# Patient Record
Sex: Male | Born: 1955 | Race: White | Hispanic: No | Smoking: Never smoker
Health system: Southern US, Community
[De-identification: ages and names within clinical notes are randomized; demographics above are authoritative.]

## PROBLEM LIST (undated history)

## (undated) ENCOUNTER — Ambulatory Visit: Admission: EM | Payer: No Typology Code available for payment source

## (undated) DIAGNOSIS — E785 Hyperlipidemia, unspecified: Secondary | ICD-10-CM

## (undated) DIAGNOSIS — T7840XA Allergy, unspecified, initial encounter: Secondary | ICD-10-CM

## (undated) DIAGNOSIS — I10 Essential (primary) hypertension: Secondary | ICD-10-CM

## (undated) DIAGNOSIS — H409 Unspecified glaucoma: Secondary | ICD-10-CM

## (undated) DIAGNOSIS — M1990 Unspecified osteoarthritis, unspecified site: Secondary | ICD-10-CM

## (undated) HISTORY — DX: Allergy, unspecified, initial encounter: T78.40XA

## (undated) HISTORY — PX: TONSILLECTOMY: SUR1361

## (undated) HISTORY — DX: Unspecified osteoarthritis, unspecified site: M19.90

## (undated) HISTORY — DX: Unspecified glaucoma: H40.9

## (undated) HISTORY — DX: Hyperlipidemia, unspecified: E78.5

---

## 2020-12-28 ENCOUNTER — Ambulatory Visit: Payer: 59 | Admitting: Internal Medicine

## 2021-01-04 ENCOUNTER — Other Ambulatory Visit: Payer: Self-pay | Admitting: Internal Medicine

## 2021-01-04 ENCOUNTER — Encounter: Payer: Self-pay | Admitting: Internal Medicine

## 2021-01-04 ENCOUNTER — Other Ambulatory Visit: Payer: Self-pay

## 2021-01-04 ENCOUNTER — Ambulatory Visit (INDEPENDENT_AMBULATORY_CARE_PROVIDER_SITE_OTHER): Payer: No Typology Code available for payment source | Admitting: Internal Medicine

## 2021-01-04 VITALS — BP 161/84 | HR 69 | Temp 98.5°F | Ht 71.06 in | Wt 265.8 lb

## 2021-01-04 DIAGNOSIS — R5383 Other fatigue: Secondary | ICD-10-CM

## 2021-01-04 DIAGNOSIS — E785 Hyperlipidemia, unspecified: Secondary | ICD-10-CM | POA: Diagnosis not present

## 2021-01-04 DIAGNOSIS — M4810 Ankylosing hyperostosis [Forestier], site unspecified: Secondary | ICD-10-CM

## 2021-01-04 DIAGNOSIS — R0602 Shortness of breath: Secondary | ICD-10-CM

## 2021-01-04 DIAGNOSIS — R933 Abnormal findings on diagnostic imaging of other parts of digestive tract: Secondary | ICD-10-CM

## 2021-01-04 DIAGNOSIS — R011 Cardiac murmur, unspecified: Secondary | ICD-10-CM | POA: Insufficient documentation

## 2021-01-04 LAB — URINALYSIS, ROUTINE W REFLEX MICROSCOPIC
Bilirubin, UA: NEGATIVE
Glucose, UA: NEGATIVE
Ketones, UA: NEGATIVE
Leukocytes,UA: NEGATIVE
Nitrite, UA: NEGATIVE
Protein,UA: NEGATIVE
RBC, UA: NEGATIVE
Specific Gravity, UA: 1.02 (ref 1.005–1.030)
Urobilinogen, Ur: 0.2 mg/dL (ref 0.2–1.0)
pH, UA: 5.5 (ref 5.0–7.5)

## 2021-01-04 LAB — BAYER DCA HB A1C WAIVED: HB A1C (BAYER DCA - WAIVED): 5.8 % (ref <7.0)

## 2021-01-04 MED ORDER — FLUTICASONE PROPIONATE 50 MCG/ACT NA SUSP: 2.0000 | Freq: Every day | NASAL | 6 refills | Status: DC

## 2021-01-04 MED ORDER — FEXOFENADINE HCL 180 MG PO TABS: 180.0000 mg | ORAL_TABLET | Freq: Every day | ORAL | 1 refills | Status: DC

## 2021-01-04 NOTE — Telephone Encounter (Signed)
Routing to provider  

## 2021-01-04 NOTE — Progress Notes (Signed)
BP (!) 161/84   Pulse 69   Temp 98.5 F (36.9 C) (Oral)   Ht 5' 11.06" (1.805 m)   Wt 265 lb 12.8 oz (120.6 kg)   SpO2 96%   BMI 37.01 kg/m    Subjective:    Patient ID: Jason Frye, male    DOB: April 20, 1956, 65 y.o.   MRN: [id number]  Chief Complaint  Patient presents with  . New Patient (Initial Visit)  . Hyperlipidemia    HPI: Jason Frye is a 65 y.o. 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 Has had tingling in right thigh per pt , no muscle weakness has had symptoms which worsen.  Feels fatigued most of the time.      Sister had LBBB and CHF diagnosed when she was in her mid 28's    Hyperlipidemia This is a chronic problem. Associated symptoms include myalgias and shortness of breath. Pertinent negatives include no chest pain, focal sensory loss, focal weakness or leg pain.  Shortness of Breath This is a new problem. The current episode started more than 1 month ago. Pertinent negatives include no abdominal pain, chest pain, claudication, ear pain, fever, headaches, hemoptysis, leg pain, leg swelling, orthopnea, PND, rash, rhinorrhea, sore throat, sputum production, swollen glands, syncope, vomiting or wheezing.    Chief Complaint  Patient presents with  . New Patient (Initial Visit)  . Hyperlipidemia    Relevant past medical, surgical, family and social history reviewed and updated as indicated. Interim medical history since our last visit reviewed. Allergies and medications reviewed and updated.  Review of Systems  Constitutional: Positive for fatigue. Negative for activity change, appetite change, chills and fever.  HENT: Negative for congestion, ear discharge, ear pain, facial swelling, rhinorrhea and sore throat.   Eyes: Negative for pain, discharge and itching.  Respiratory: Positive for  shortness of breath. Negative for cough, hemoptysis, sputum production, chest tightness and wheezing.   Cardiovascular: Negative for chest pain, palpitations, orthopnea, claudication, leg swelling, syncope and PND.  Gastrointestinal: Negative for abdominal distention, abdominal pain, blood in stool, constipation, diarrhea, nausea and vomiting.  Endocrine: Positive for polyphagia. Negative for cold intolerance, heat intolerance, polydipsia and polyuria.  Genitourinary: Negative for difficulty urinating, dysuria, flank pain, frequency, hematuria and urgency.  Musculoskeletal: Positive for back pain and myalgias. Negative for arthralgias, gait problem and joint swelling.  Skin: Negative for color change, rash and wound.  Neurological: Negative for dizziness, tremors, focal weakness, speech difficulty, weakness, light-headedness, numbness and headaches.  Hematological: Does not bruise/bleed easily.  Psychiatric/Behavioral: Negative for agitation, confusion, decreased concentration, sleep disturbance and suicidal ideas.    Per HPI unless specifically indicated above  History reviewed. No pertinent surgical history.   Active Ambulatory Problems    Diagnosis Date Noted  . Cardiac murmur 01/04/2021  . SOB (shortness of breath) 01/04/2021  . Hyperlipidemia 01/04/2021  . DISH (diffuse idiopathic skeletal hyperostosis) 01/04/2021  . Other fatigue 01/04/2021   Resolved Ambulatory Problems    Diagnosis Date Noted  . No Resolved Ambulatory Problems   Past Medical History:  Diagnosis Date  . Allergy   . Arthritis   . Glaucoma        Objective:    BP (!) 161/84   Pulse 69   Temp 98.5 F (36.9 C) (Oral)   Ht 5' 11.06" (1.805  m)   Wt 265 lb 12.8 oz (120.6 kg)   SpO2 96%   BMI 37.01 kg/m   Wt Readings from Last 3 Encounters:  01/04/21 265 lb 12.8 oz (120.6 kg)    Physical Exam Vitals and nursing note reviewed.  Constitutional:      General: He is not in acute distress.     Appearance: Normal appearance. He is not ill-appearing or diaphoretic.  HENT:     Head: Normocephalic and atraumatic.     Right Ear: Tympanic membrane and external ear normal. There is no impacted cerumen.     Left Ear: External ear normal.     Nose: No congestion or rhinorrhea.     Mouth/Throat:     Pharynx: No oropharyngeal exudate or posterior oropharyngeal erythema.  Eyes:     Conjunctiva/sclera: Conjunctivae normal.     Pupils: Pupils are equal, round, and reactive to light.  Cardiovascular:     Rate and Rhythm: Normal rate and regular rhythm.     Heart sounds: No murmur heard. No friction rub. No gallop.   Pulmonary:     Effort: No respiratory distress.     Breath sounds: No stridor. No wheezing or rhonchi.  Chest:     Chest wall: No tenderness.  Abdominal:     General: Abdomen is flat. Bowel sounds are normal. There is no distension.     Palpations: Abdomen is soft. There is no mass.     Tenderness: There is no abdominal tenderness. There is no guarding.  Musculoskeletal:        General: No swelling or deformity.     Cervical back: Normal range of motion and neck supple. No rigidity or tenderness.     Right lower leg: No edema.     Left lower leg: No edema.  Skin:    General: Skin is warm and dry.     Coloration: Skin is not jaundiced.     Findings: No erythema.  Neurological:     Mental Status: He is alert and oriented to person, place, and time. Mental status is at baseline.  Psychiatric:        Mood and Affect: Mood normal.        Behavior: Behavior normal.        Thought Content: Thought content normal.        Judgment: Judgment normal.     No results found for this or any previous visit.      Current Outpatient Medications:  .  B Complex Vitamins (VITAMIN B-COMPLEX) TABS, Take 1 tablet by mouth daily., Disp: , Rfl:  .  meloxicam (MOBIC) 7.5 MG tablet, , Disp: , Rfl:  .  Multiple Vitamin (MULTI-VITAMINS) TABS, Take by mouth., Disp: , Rfl:  .  simvastatin  (ZOCOR) 40 MG tablet, , Disp: , Rfl:       Assessment & Plan:  1. DISH will refert o PT  Refer to neurosurgery  Continue meloxicam for such   2. HLD is on simvastatin for such  recheck FLP, check LFT's work on diet, SE of meds explained to pt. low fat and high fiber diet explained to pt.   3. Fatigue Check TSH / A1c a ? Sec to cardiac / valvular abnormalities  dysfunction  4. HLD :  Is on simavstatin 40 mg daily  recheck FLP, check LFT's work on diet, SE of meds explained to pt. low fat and high fiber diet explained to pt.  4. Allergic rhinitis / seasonal allergy Dry  cough nasal congestion  Start flonase/ allegra  5. SOB / fatigue / Cardiac murmer  Will check ECHo   6. abnl Cscope - will need to recheck with Gi for such  Had 10 polyps removed per Pts verbal record.  Will need to obtain records.   Problem List Items Addressed This Visit   None      Follow up plan: No follow-ups on file.  Health Maintenance : PSA : check  Cscope : 3 yrs ago. Found 10 polyps and to go back this year. Pneumonia vaccine : yes 2021

## 2021-01-04 NOTE — Telephone Encounter (Signed)
Requested medication (s) are due for refill today: yes  Requested medication (s) are on the active medication list: yes  Last refill:    Future visit scheduled: yes  Notes to clinic:  Pharmacy comment: Alternative Requested:NOT COVERED.   Requested Prescriptions  Pending Prescriptions Disp Refills   flunisolide (NASALIDE) 25 MCG/ACT (0.025%) SOLN [Pharmacy Med Name: FLUNISOLIDE 0.025% SPRAY]  0      Ear, Nose, and Throat: Nasal Preparations - Corticosteroids Passed - 01/04/2021 10:30 AM      Passed - Valid encounter within last 12 months    Recent Outpatient Visits           Today DISH (diffuse idiopathic skeletal hyperostosis)   [facility] Vigg, Avanti, MD       Future Appointments             In 4 weeks Vigg, Avanti, MD [facility]

## 2021-01-04 NOTE — Telephone Encounter (Signed)
Seen today. 

## 2021-01-05 LAB — CBC WITH DIFFERENTIAL/PLATELET
Basophils Absolute: 0 10*3/uL (ref 0.0–0.2)
Basos: 1 %
EOS (ABSOLUTE): 0.2 10*3/uL (ref 0.0–0.4)
Eos: 3 %
Hematocrit: 43.3 % (ref 37.5–51.0)
Hemoglobin: 14.6 g/dL (ref 13.0–17.7)
Immature Grans (Abs): 0 10*3/uL (ref 0.0–0.1)
Immature Granulocytes: 0 %
Lymphocytes Absolute: 2.6 10*3/uL (ref 0.7–3.1)
Lymphs: 35 %
MCH: 30.4 pg (ref 26.6–33.0)
MCHC: 33.7 g/dL (ref 31.5–35.7)
MCV: 90 fL (ref 79–97)
Monocytes Absolute: 0.7 10*3/uL (ref 0.1–0.9)
Monocytes: 9 %
Neutrophils Absolute: 3.8 10*3/uL (ref 1.4–7.0)
Neutrophils: 52 %
Platelets: 264 10*3/uL (ref 150–450)
RBC: 4.8 x10E6/uL (ref 4.14–5.80)
RDW: 13 % (ref 11.6–15.4)
WBC: 7.3 10*3/uL (ref 3.4–10.8)

## 2021-01-05 LAB — LIPID PANEL
Chol/HDL Ratio: 4.4 ratio (ref 0.0–5.0)
Cholesterol, Total: 173 mg/dL (ref 100–199)
HDL: 39 mg/dL — ABNORMAL LOW (ref >39)
LDL Chol Calc (NIH): 99 mg/dL (ref 0–99)
Triglycerides: 202 mg/dL — ABNORMAL HIGH (ref 0–149)
VLDL Cholesterol Cal: 35 mg/dL (ref 5–40)

## 2021-01-05 LAB — COMPREHENSIVE METABOLIC PANEL
ALT: 32 IU/L (ref 0–44)
AST: 26 IU/L (ref 0–40)
Albumin/Globulin Ratio: 1.6 (ref 1.2–2.2)
Albumin: 4.4 g/dL (ref 3.8–4.8)
Alkaline Phosphatase: 73 IU/L (ref 44–121)
BUN/Creatinine Ratio: 19 (ref 10–24)
BUN: 22 mg/dL (ref 8–27)
Bilirubin Total: 0.4 mg/dL (ref 0.0–1.2)
CO2: 22 mmol/L (ref 20–29)
Calcium: 9.8 mg/dL (ref 8.6–10.2)
Chloride: 100 mmol/L (ref 96–106)
Creatinine, Ser: 1.16 mg/dL (ref 0.76–1.27)
Globulin, Total: 2.7 g/dL (ref 1.5–4.5)
Glucose: 100 mg/dL — ABNORMAL HIGH (ref 65–99)
Potassium: 4.2 mmol/L (ref 3.5–5.2)
Sodium: 141 mmol/L (ref 134–144)
Total Protein: 7.1 g/dL (ref 6.0–8.5)
eGFR: 71 mL/min/{1.73_m2} (ref >59)

## 2021-01-05 LAB — TSH: TSH: 2.82 u[IU]/mL (ref 0.450–4.500)

## 2021-01-05 LAB — PSA: Prostate Specific Ag, Serum: 0.7 ng/mL (ref 0.0–4.0)

## 2021-01-16 ENCOUNTER — Other Ambulatory Visit: Payer: Self-pay | Admitting: Internal Medicine

## 2021-01-25 ENCOUNTER — Other Ambulatory Visit: Payer: Self-pay

## 2021-01-25 ENCOUNTER — Encounter: Payer: Self-pay | Admitting: Internal Medicine

## 2021-01-25 ENCOUNTER — Ambulatory Visit
Admission: RE | Admit: 2021-01-25 | Discharge: 2021-01-25 | Disposition: A | Discharge status: Home / Self Care | Payer: No Typology Code available for payment source | Source: Ambulatory Visit | Attending: Internal Medicine | Admitting: Internal Medicine

## 2021-01-25 ENCOUNTER — Ambulatory Visit (INDEPENDENT_AMBULATORY_CARE_PROVIDER_SITE_OTHER): Payer: No Typology Code available for payment source | Admitting: Internal Medicine

## 2021-01-25 VITALS — BP 126/77 | HR 71 | Temp 98.3°F | Ht 71.06 in | Wt 255.4 lb

## 2021-01-25 DIAGNOSIS — R5383 Other fatigue: Secondary | ICD-10-CM | POA: Diagnosis not present

## 2021-01-25 DIAGNOSIS — R0600 Dyspnea, unspecified: Secondary | ICD-10-CM | POA: Diagnosis not present

## 2021-01-25 DIAGNOSIS — M4810 Ankylosing hyperostosis [Forestier], site unspecified: Secondary | ICD-10-CM | POA: Diagnosis not present

## 2021-01-25 DIAGNOSIS — R0602 Shortness of breath: Secondary | ICD-10-CM

## 2021-01-25 DIAGNOSIS — E785 Hyperlipidemia, unspecified: Secondary | ICD-10-CM | POA: Diagnosis not present

## 2021-01-25 DIAGNOSIS — Z1211 Encounter for screening for malignant neoplasm of colon: Secondary | ICD-10-CM

## 2021-01-25 LAB — ECHOCARDIOGRAM COMPLETE
AR max vel: 1.34 cm2
AV Area VTI: 1.28 cm2
AV Area mean vel: 1.1 cm2
AV Mean grad: 10.7 mmHg
AV Peak grad: 17 mmHg
Ao pk vel: 2.06 m/s
Area-P 1/2: 3.87 cm2
Height: 71.06 in
S' Lateral: 3 cm
Weight: 4086.4 oz

## 2021-01-25 NOTE — Progress Notes (Signed)
BP 126/77   Pulse 71   Temp 98.3 F (36.8 C) (Oral)   Ht 5' 11.06" (1.805 m)   Wt 255 lb 6.4 oz (115.8 kg)   SpO2 98%   BMI 35.56 kg/m    Subjective:    Patient ID: Jason Frye, male    DOB: [birth year], 65 y.o.   MRN: [id number]  Chief Complaint  Patient presents with   DISH   Lab review    HPI: Jason Frye is a 65 y.o. male  Pt is here for a follow up from his last visit, to have an ECHo tdoay  still co SOBOE occaisonal ,Fatigue +ve   Shortness of Breath This is a recurrent problem. The current episode started more than 1 month ago. The problem has been waxing and waning. Associated symptoms include coryza. Pertinent negatives include no abdominal pain, chest pain, claudication, fever, hemoptysis, leg pain, leg swelling, neck pain, orthopnea, PND, rash, rhinorrhea, sore throat, sputum production, swollen glands, syncope, vomiting or wheezing. Associated symptoms comments: Mornings has some dry cough .   Chief Complaint  Patient presents with   DISH   Lab review    Relevant past medical, surgical, family and social history reviewed and updated as indicated. Interim medical history since our last visit reviewed. Allergies and medications reviewed and updated.  Review of Systems  Constitutional:  Negative for fever.  HENT:  Negative for rhinorrhea and sore throat.   Respiratory:  Positive for shortness of breath. Negative for hemoptysis, sputum production and wheezing.   Cardiovascular:  Negative for chest pain, orthopnea, claudication, leg swelling, syncope and PND.  Gastrointestinal:  Negative for abdominal pain and vomiting.  Musculoskeletal:  Negative for neck pain.  Skin:  Negative for rash.   Per HPI unless specifically indicated above     Objective:    BP 126/77   Pulse 71   Temp 98.3 F (36.8 C) (Oral)   Ht 5' 11.06" (1.805 m)   Wt 255 lb 6.4 oz (115.8 kg)   SpO2 98%   BMI 35.56 kg/m   Wt Readings from Last 3 Encounters:  01/25/21 255 lb 6.4 oz  (115.8 kg)  01/04/21 265 lb 12.8 oz (120.6 kg)    Physical Exam Vitals and nursing note reviewed.  Constitutional:      General: He is not in acute distress.    Appearance: Normal appearance. He is not ill-appearing or diaphoretic.  HENT:     Head: Normocephalic and atraumatic.     Right Ear: Tympanic membrane and external ear normal. There is no impacted cerumen.     Left Ear: External ear normal.     Nose: No congestion or rhinorrhea.     Mouth/Throat:     Pharynx: No oropharyngeal exudate or posterior oropharyngeal erythema.  Eyes:     Conjunctiva/sclera: Conjunctivae normal.     Pupils: Pupils are equal, round, and reactive to light.  Cardiovascular:     Rate and Rhythm: Normal rate and regular rhythm.     Heart sounds: No murmur heard.   No friction rub. No gallop.  Pulmonary:     Effort: No respiratory distress.     Breath sounds: No stridor. No wheezing or rhonchi.  Chest:     Chest wall: No tenderness.  Abdominal:     General: Abdomen is flat. Bowel sounds are normal.     Palpations: Abdomen is soft. There is no mass.     Tenderness: There is no abdominal tenderness.  Musculoskeletal:  Cervical back: Normal range of motion and neck supple. No rigidity or tenderness.     Left lower leg: No edema.  Skin:    General: Skin is warm and dry.  Neurological:     Mental Status: He is alert.    Results for orders placed or performed in visit on 01/04/21  TSH  Result Value Ref Range   TSH 2.820 0.450 - 4.500 uIU/mL  PSA  Result Value Ref Range   Prostate Specific Ag, Serum 0.7 0.0 - 4.0 ng/mL  Lipid panel  Result Value Ref Range   Cholesterol, Total 173 100 - 199 mg/dL   Triglycerides 202 (H) 0 - 149 mg/dL   HDL 39 (L) >39 mg/dL   VLDL Cholesterol Cal 35 5 - 40 mg/dL   LDL Chol Calc (NIH) 99 0 - 99 mg/dL   Chol/HDL Ratio 4.4 0.0 - 5.0 ratio  CBC with Differential/Platelet  Result Value Ref Range   WBC 7.3 3.4 - 10.8 x10E3/uL   RBC 4.80 4.14 - 5.80 x10E6/uL    Hemoglobin 14.6 13.0 - 17.7 g/dL   Hematocrit 43.3 37.5 - 51.0 %   MCV 90 79 - 97 fL   MCH 30.4 26.6 - 33.0 pg   MCHC 33.7 31.5 - 35.7 g/dL   RDW 13.0 11.6 - 15.4 %   Platelets 264 150 - 450 x10E3/uL   Neutrophils 52 Not Estab. %   Lymphs 35 Not Estab. %   Monocytes 9 Not Estab. %   Eos 3 Not Estab. %   Basos 1 Not Estab. %   Neutrophils Absolute 3.8 1.4 - 7.0 x10E3/uL   Lymphocytes Absolute 2.6 0.7 - 3.1 x10E3/uL   Monocytes Absolute 0.7 0.1 - 0.9 x10E3/uL   EOS (ABSOLUTE) 0.2 0.0 - 0.4 x10E3/uL   Basophils Absolute 0.0 0.0 - 0.2 x10E3/uL   Immature Granulocytes 0 Not Estab. %   Immature Grans (Abs) 0.0 0.0 - 0.1 x10E3/uL  Comprehensive metabolic panel  Result Value Ref Range   Glucose 100 (H) 65 - 99 mg/dL   BUN 22 8 - 27 mg/dL   Creatinine, Ser 1.16 0.76 - 1.27 mg/dL   eGFR 71 >59 mL/min/1.73   BUN/Creatinine Ratio 19 10 - 24   Sodium 141 134 - 144 mmol/L   Potassium 4.2 3.5 - 5.2 mmol/L   Chloride 100 96 - 106 mmol/L   CO2 22 20 - 29 mmol/L   Calcium 9.8 8.6 - 10.2 mg/dL   Total Protein 7.1 6.0 - 8.5 g/dL   Albumin 4.4 3.8 - 4.8 g/dL   Globulin, Total 2.7 1.5 - 4.5 g/dL   Albumin/Globulin Ratio 1.6 1.2 - 2.2   Bilirubin Total 0.4 0.0 - 1.2 mg/dL   Alkaline Phosphatase 73 44 - 121 IU/L   AST 26 0 - 40 IU/L   ALT 32 0 - 44 IU/L  Urinalysis, Routine w reflex microscopic  Result Value Ref Range   Specific Gravity, UA 1.020 1.005 - 1.030   pH, UA 5.5 5.0 - 7.5   Color, UA Yellow Yellow   Appearance Ur Clear Clear   Leukocytes,UA Negative Negative   Protein,UA Negative Negative/Trace   Glucose, UA Negative Negative   Ketones, UA Negative Negative   RBC, UA Negative Negative   Bilirubin, UA Negative Negative   Urobilinogen, Ur 0.2 0.2 - 1.0 mg/dL   Nitrite, UA Negative Negative  Bayer DCA Hb A1c Waived (STAT)  Result Value Ref Range   HB A1C (BAYER DCA - WAIVED) 5.8 <  7.0 %        Current Outpatient Medications:    B Complex Vitamins (VITAMIN B-COMPLEX) TABS,  Take 1 tablet by mouth daily., Disp: , Rfl:    meloxicam (MOBIC) 7.5 MG tablet, , Disp: , Rfl:    Multiple Vitamin (MULTI-VITAMINS) TABS, Take by mouth., Disp: , Rfl:    simvastatin (ZOCOR) 40 MG tablet, , Disp: , Rfl:    fexofenadine (ALLEGRA ALLERGY) 180 MG tablet, Take 1 tablet (180 mg total) by mouth daily., Disp: 30 tablet, Rfl: 1   fluticasone (FLONASE) 50 MCG/ACT nasal spray, Place 2 sprays into both nostrils daily., Disp: 16 g, Rfl: 6    Assessment & Plan:  HLD recheck FLP, check LFT's work on diet, SE of meds explained to pt. low fat and high fiber diet explained to pt. TG high  Has been eating more veggies, more salads, smaller portions.  Saw his TG cut back on chips , icecream.  Has done lifestyle modifications.  Has lost about 10 lbs of weight.   Ref. Range 01/04/2021 10:04  Total CHOL/HDL Ratio Latest Ref Range: 0.0 - 5.0 ratio 4.4  Cholesterol, Total Latest Ref Range: 100 - 199 mg/dL 173  HDL Cholesterol Latest Ref Range: >39 mg/dL 39 (L)  Triglycerides Latest Ref Range: 0 - 149 mg/dL 202 (H)  VLDL Cholesterol Cal Latest Ref Range: 5 - 40 mg/dL 35  LDL Chol Calc (NIH) Latest Ref Range: 0 - 99 mg/dL 99    2. Prediabetes Lifestyle modifications advised to pt. A1c at 5.8   Portion control and avoiding high carb low fat diet advised.  Diet plan given to pt   exercise plan given and encouraged.  To increase exercise to 150 mins a week ie 21/2 hours a week. Pt verbalises understanding of the above.    3. SOB :To have an ECHO today   4 DISH will refert o PT Refer to neurosurgery Continue meloxicam for such   5.HLD is on simvastatin for such  recheck FLP, check LFT's work on diet, SE of meds explained to pt. low fat and high fiber diet explained to pt.     6.Fatigue Check TSH / A1c a ? Sec to cardiac / valvular abnormalities  dysfunction   7. HLD :  Is on simavstatin 40 mg daily  recheck FLP, check LFT's work on diet, SE of meds explained to pt. low fat and high  fiber diet explained to pt.   8.Allergic rhinitis / seasonal allergy Dry cough nasal congestion Start flonase/ allegra   9.Abnl Cscope - fu and mx per GI      Problem List Items Addressed This Visit   None    Orders Placed This Encounter  Procedures   Ambulatory referral to Gastroenterology     No orders of the defined types were placed in this encounter.    Follow up plan: No follow-ups on file.

## 2021-01-25 NOTE — Progress Notes (Signed)
*  PRELIMINARY RESULTS* Echocardiogram 2D Echocardiogram has been performed.  Jason Frye 01/25/2021, 12:21 PM

## 2021-02-01 ENCOUNTER — Other Ambulatory Visit: Payer: Self-pay

## 2021-02-01 ENCOUNTER — Telehealth (INDEPENDENT_AMBULATORY_CARE_PROVIDER_SITE_OTHER): Payer: No Typology Code available for payment source | Admitting: Internal Medicine

## 2021-02-01 ENCOUNTER — Telehealth: Payer: Self-pay | Admitting: *Deleted

## 2021-02-01 ENCOUNTER — Encounter: Payer: Self-pay | Admitting: Internal Medicine

## 2021-02-01 DIAGNOSIS — M4810 Ankylosing hyperostosis [Forestier], site unspecified: Secondary | ICD-10-CM

## 2021-02-01 DIAGNOSIS — I5189 Other ill-defined heart diseases: Secondary | ICD-10-CM

## 2021-02-01 MED ORDER — VERAPAMIL HCL ER 120 MG PO TBCR: 120.0000 mg | EXTENDED_RELEASE_TABLET | Freq: Every day | ORAL | 2 refills | Status: DC

## 2021-02-01 MED ORDER — SIMVASTATIN 40 MG PO TABS: 40.0000 mg | ORAL_TABLET | Freq: Every evening | ORAL | 4 refills | Status: DC

## 2021-02-01 NOTE — Progress Notes (Addendum)
Chief Complaint  Patient presents with   Shortness of Breath    SOB has been getting much better in the last week or so   Echo     Review results     There were no vitals taken for this visit.   Subjective:    Patient ID: Jason Frye, male    DOB: May 24, 1956, 65 y.o.   MRN: [id number]  Chief Complaint  Patient presents with   Shortness of Breath    SOB has been getting much better in the last week or so   Echo     Review results    HPI: Jason Frye is a 65 y.o. male  Has a HO DISH , here for a fu on SOB / ECHO which shows Grade 1 dystolic dysfunction :   Shortness of Breath This is a chronic problem. Episode onset: Has SOBOE , better. Comes out of the blue per pt. Has had fatigue too. Better per pt. Pertinent negatives include no abdominal pain, chest pain, claudication, coryza, hemoptysis, leg pain, leg swelling or neck pain.   Chief Complaint  Patient presents with   Shortness of Breath    SOB has been getting much better in the last week or so   Echo     Review results    Relevant past medical, surgical, family and social history reviewed and updated as indicated. Interim medical history since our last visit reviewed. Allergies and medications reviewed and updated.  Review of Systems  Respiratory:  Positive for shortness of breath. Negative for hemoptysis.   Cardiovascular:  Negative for chest pain, claudication and leg swelling.  Gastrointestinal:  Negative for abdominal pain.  Musculoskeletal:  Negative for neck pain.   Per HPI unless specifically indicated above     Objective:    There were no vitals taken for this visit.  Wt Readings from Last 3 Encounters:  01/25/21 255 lb 6.4 oz (115.8 kg)  01/04/21 265 lb 12.8 oz (120.6 kg)    Physical Exam Constitutional:      General: He is not in acute distress.    Appearance: He is well-developed. He is not ill-appearing, toxic-appearing or diaphoretic.  Neurological:     Mental Status: He is alert.     Unable to peform sec to virtual visit.   Results for orders placed or performed during the hospital encounter of 01/25/21  ECHOCARDIOGRAM COMPLETE  Result Value Ref Range   Weight 4,086.4 oz   Height 71.06 in   BP 126/77 mmHg   Ao pk vel 2.06 m/s   AV Area VTI 1.28 cm2   AR max vel 1.34 cm2   AV Mean grad 10.7 mmHg   AV Peak grad 17.0 mmHg   S' Lateral 3.00 cm   AV Area mean vel 1.10 cm2   Area-P 1/2 3.87 cm2        Current Outpatient Medications:    verapamil (CALAN-SR) 120 MG CR tablet, Take 1 tablet (120 mg total) by mouth at bedtime., Disp: 30 tablet, Rfl: 2   B Complex Vitamins (VITAMIN B-COMPLEX) TABS, Take 1 tablet by mouth daily., Disp: , Rfl:    fexofenadine (ALLEGRA ALLERGY) 180 MG tablet, Take 1 tablet (180 mg total) by mouth daily., Disp: 30 tablet, Rfl: 1   fluticasone (FLONASE) 50 MCG/ACT nasal spray, Place 2 sprays into both nostrils daily., Disp: 16 g, Rfl: 6   meloxicam (MOBIC) 7.5 MG tablet, , Disp: , Rfl:    Multiple Vitamin (MULTI-VITAMINS) TABS, Take by  mouth., Disp: , Rfl:    simvastatin (ZOCOR) 40 MG tablet, Take 1 tablet (40 mg total) by mouth every evening., Disp: 90 tablet, Rfl: 4    Assessment & Plan:  Grade 1 dystolic dysfunction : No edema continues to have SOB Will start pt on verapamil for such. Will refer to cards .  Htn prehypertension ? Has had some high readings on arrival 130 at home - 120's  125/75 mm hg.    Problem List Items Addressed This Visit       Musculoskeletal and Integument   DISH (diffuse idiopathic skeletal hyperostosis)   Relevant Orders   Ambulatory referral to Cardiology   Other Visit Diagnoses     Diastolic dysfunction    -  Primary   Relevant Orders   Ambulatory referral to Cardiology        Orders Placed This Encounter  Procedures   Ambulatory referral to Cardiology     Meds ordered this encounter  Medications   verapamil (CALAN-SR) 120 MG CR tablet    Sig: Take 1 tablet (120 mg total) by  mouth at bedtime.    Dispense:  30 tablet    Refill:  2   simvastatin (ZOCOR) 40 MG tablet    Sig: Take 1 tablet (40 mg total) by mouth every evening.    Dispense:  90 tablet    Refill:  4     Follow up plan: Return in about 6 weeks (around 03/15/2021).   This visit was completed via video visit through MyChart due to the restrictions of the COVID-19 pandemic. All issues as above were discussed and addressed. Physical exam was done as above through visual confirmation on video through MyChart. If it was felt that the patient should be evaluated in the office, they were directed there. The patient verbally consented to this visit. Location of the patient: home Location of the provider: work Those involved with this call:  Provider: Loura Pardon, MD CMA: Rolley Sims, CMA Front Desk/Registration: Beverely Pace  Time spent on call:  10 minutes with patient face to face via video conference. More than 50% of this time was spent in counseling and coordination of care. 10 minutes total spent in review of patient's record and preparation of their chart.

## 2021-02-01 NOTE — Telephone Encounter (Signed)
LVM for pt. To call and schedule next appointment along with a LAB appointment per Dr. Charlotta Newton. He needs a  6 week Fu on new bp meds/. Diastolic dysfunction CMP, FLP Labs 1 week prior to next visit.

## 2021-02-03 ENCOUNTER — Telehealth: Payer: Self-pay

## 2021-02-03 NOTE — Telephone Encounter (Signed)
Copied from CRM [id number]. Topic: General - Other >> Feb 03, 2021  2:33 PM Payton Spark N wrote: Reason for CRM: Pt called in stating when he was going to pick up his medications verapamil (CALAN-SR) 120 MG CR tablet at the pharmacy, that they told him that there is a indication or interaction with another medication he is taken, and wanted someone to reach out to see what he should next, or if he should take it.

## 2021-02-06 ENCOUNTER — Other Ambulatory Visit: Payer: Self-pay | Admitting: Internal Medicine

## 2021-02-06 MED ORDER — VERAPAMIL HCL ER 120 MG PO TBCR: 120.0000 mg | EXTENDED_RELEASE_TABLET | Freq: Every day | ORAL | 2 refills | Status: DC

## 2021-02-06 NOTE — Telephone Encounter (Signed)
Left message for patient to give our office a call back

## 2021-02-06 NOTE — Telephone Encounter (Signed)
Spoke with patient and notified him per Dr.Vigg patient is OK to go ahead and start the prescription Verapamil. Patient verbalized understanding and has no further questions at this time.

## 2021-02-06 NOTE — Telephone Encounter (Signed)
There arent any interactions ok to start. Pl let pt know thnx.

## 2021-02-12 DIAGNOSIS — U071 COVID-19: Secondary | ICD-10-CM | POA: Insufficient documentation

## 2021-02-20 ENCOUNTER — Telehealth (INDEPENDENT_AMBULATORY_CARE_PROVIDER_SITE_OTHER): Payer: No Typology Code available for payment source | Admitting: Internal Medicine

## 2021-02-20 ENCOUNTER — Encounter: Payer: Self-pay | Admitting: Internal Medicine

## 2021-02-20 VITALS — BP 129/71 | HR 83

## 2021-02-20 DIAGNOSIS — U071 COVID-19: Secondary | ICD-10-CM

## 2021-02-20 MED ORDER — FEXOFENADINE HCL 180 MG PO TABS: 180.0000 mg | ORAL_TABLET | Freq: Every day | ORAL | 1 refills | Status: DC

## 2021-02-20 MED ORDER — BENZONATATE 100 MG PO CAPS: 100.0000 mg | ORAL_CAPSULE | Freq: Two times a day (BID) | ORAL | 1 refills | Status: DC | PRN

## 2021-02-20 MED ORDER — AZITHROMYCIN 250 MG PO TABS: ORAL_TABLET | ORAL | 0 refills | Status: AC

## 2021-02-20 NOTE — Progress Notes (Signed)
BP 129/71   Pulse 83    Subjective:    Patient ID: Jason Frye, male    DOB: 1956/01/17, 65 y.o.   MRN: [id number]  Chief Complaint  Patient presents with   Covid Positive    Tested positive on last week Monday, cough, fever(last night 101.8),chills, congestions, tired, achy, altered sense of taste and lot of fatigue    HPI: Daton Szilagyi is a 65 y.o. male   This visit was completed via video visit through MyChart due to the restrictions of the COVID-19 pandemic. All issues as above were discussed and addressed. Physical exam was done as above through visual confirmation on video through MyChart. If it was felt that the patient should be evaluated in the office, they were directed there. The patient verbally consented to this visit. Location of the patient: home Location of the provider: work Those involved with this call:  Provider: Loura Pardon, MD CMA: Tristan Schroeder, CMA Front Desk/Registration: Harriet Pho  Time spent on call: 15` minutes with patient face to face via video conference. More than 50% of this time was spent in counseling and coordination of care. 10 minutes total spent in review of patient's record and preparation of their chart.  When he started to show signs last Sunday.  Still has a cough - has green phlegm , started feeling bettr last Wednesday and then Thursday onwards started feling worse, little bit of a fevr cough, congestion, achy  Hadnt lost sense of taste, although not what it used to be. Taking tylenol and ibubrufen.   Fever was 101 F   URI  This is a new problem. The current episode started in the past 7 days.   Chief Complaint  Patient presents with   Covid Positive    Tested positive on last week Monday, cough, fever(last night 101.8),chills, congestions, tired, achy, altered sense of taste and lot of fatigue    Relevant past medical, surgical, family and social history reviewed and updated as indicated. Interim medical history since our  last visit reviewed. Allergies and medications reviewed and updated.  Review of Systems  Per HPI unless specifically indicated above     Objective:    BP 129/71   Pulse 83   Wt Readings from Last 3 Encounters:  01/25/21 255 lb 6.4 oz (115.8 kg)  01/04/21 265 lb 12.8 oz (120.6 kg)    Physical Exam  Unable to peform sec to virtual visit.   Results for orders placed or performed during the hospital encounter of 01/25/21  ECHOCARDIOGRAM COMPLETE  Result Value Ref Range   Weight 4,086.4 oz   Height 71.06 in   BP 126/77 mmHg   Ao pk vel 2.06 m/s   AV Area VTI 1.28 cm2   AR max vel 1.34 cm2   AV Mean grad 10.7 mmHg   AV Peak grad 17.0 mmHg   S' Lateral 3.00 cm   AV Area mean vel 1.10 cm2   Area-P 1/2 3.87 cm2        Current Outpatient Medications:    B Complex Vitamins (VITAMIN B-COMPLEX) TABS, Take 1 tablet by mouth daily., Disp: , Rfl:    meloxicam (MOBIC) 7.5 MG tablet, , Disp: , Rfl:    Multiple Vitamin (MULTI-VITAMINS) TABS, Take by mouth., Disp: , Rfl:    simvastatin (ZOCOR) 40 MG tablet, Take 1 tablet (40 mg total) by mouth every evening., Disp: 90 tablet, Rfl: 4   verapamil (CALAN-SR) 120 MG CR tablet, Take 1 tablet (120  mg total) by mouth at bedtime., Disp: 30 tablet, Rfl: 2    Assessment & Plan:  COVID : positive : last week, will need to start pt on zpak Increase fluid intake. Headahce - tyelnol every 4-6 hrs prn and alternate this with ibubrufen 800 mg q 8 hrly.  Tessalon pearls for cough.  Sinus pressure: use steam inhalation.  OTC -  Allegra / claritin.  Pt verbalized understanding of such  Problem List Items Addressed This Visit   None    No orders of the defined types were placed in this encounter.    Meds ordered this encounter  Medications   azithromycin (ZITHROMAX) 250 MG tablet    Sig: Take 2 tablets on day 1, then 1 tablet daily on days 2 through 5    Dispense:  6 tablet    Refill:  0   fexofenadine (ALLEGRA ALLERGY) 180 MG tablet     Sig: Take 1 tablet (180 mg total) by mouth daily.    Dispense:  10 tablet    Refill:  1   benzonatate (TESSALON) 100 MG capsule    Sig: Take 1 capsule (100 mg total) by mouth 2 (two) times daily as needed for cough.    Dispense:  20 capsule    Refill:  1     Follow up plan: No follow-ups on file.

## 2021-02-23 ENCOUNTER — Telehealth (INDEPENDENT_AMBULATORY_CARE_PROVIDER_SITE_OTHER): Payer: Self-pay | Admitting: Gastroenterology

## 2021-02-23 DIAGNOSIS — Z86010 Personal history of colonic polyps: Secondary | ICD-10-CM

## 2021-02-23 MED ORDER — CLENPIQ 10-3.5-12 MG-GM -GM/160ML PO SOLN: 1.0000 | Freq: Once | ORAL | 0 refills | Status: AC

## 2021-02-23 NOTE — Progress Notes (Signed)
Gastroenterology Pre-Procedure Review  Request Date: 04/27/21 Requesting Physician: Dr. Maximino Greenland  PATIENT REVIEW QUESTIONS: The patient responded to the following health history questions as indicated:    1. Are you having any GI issues? no 2. Do you have a personal history of Polyps? yes (11/2017) 3. Do you have a family history of Colon Cancer or Polyps? no 4. Diabetes Mellitus? no 5. Joint replacements in the past 12 months?no 6. Major health problems in the past 3 months?no 7. Any artificial heart valves, MVP, or defibrillator?no    MEDICATIONS & ALLERGIES:    Patient reports the following regarding taking any anticoagulation/antiplatelet therapy:   Plavix, Coumadin, Eliquis, Xarelto, Lovenox, Pradaxa, Brilinta, or Effient? no Aspirin? no  Patient confirms/reports the following medications:  Current Outpatient Medications  Medication Sig Dispense Refill   azithromycin (ZITHROMAX) 250 MG tablet Take 2 tablets on day 1, then 1 tablet daily on days 2 through 5 6 tablet 0   B Complex Vitamins (VITAMIN B-COMPLEX) TABS Take 1 tablet by mouth daily.     benzonatate (TESSALON) 100 MG capsule Take 1 capsule (100 mg total) by mouth 2 (two) times daily as needed for cough. 20 capsule 1   fexofenadine (ALLEGRA ALLERGY) 180 MG tablet Take 1 tablet (180 mg total) by mouth daily. 10 tablet 1   meloxicam (MOBIC) 7.5 MG tablet      Multiple Vitamin (MULTI-VITAMINS) TABS Take by mouth.     simvastatin (ZOCOR) 40 MG tablet Take 1 tablet (40 mg total) by mouth every evening. 90 tablet 4   verapamil (CALAN-SR) 120 MG CR tablet Take 1 tablet (120 mg total) by mouth at bedtime. 30 tablet 2   No current facility-administered medications for this visit.    Patient confirms/reports the following allergies:  Allergies  Allergen Reactions   Penicillins     No orders of the defined types were placed in this encounter.   AUTHORIZATION INFORMATION Primary Insurance: 1D#: Group #:  Secondary  Insurance: 1D#: Group #:  SCHEDULE INFORMATION: Date: 04/27/21 Time: Location: [facility]

## 2021-02-24 ENCOUNTER — Ambulatory Visit: Payer: Self-pay

## 2021-02-24 ENCOUNTER — Telehealth: Payer: Self-pay | Admitting: Internal Medicine

## 2021-02-24 MED ORDER — METHYLPREDNISOLONE 4 MG PO TBPK: ORAL_TABLET | ORAL | 0 refills | Status: DC

## 2021-02-24 MED ORDER — CHERATUSSIN AC 100-10 MG/5ML PO SOLN: 5.0000 mL | Freq: Three times a day (TID) | ORAL | 0 refills | Status: DC | PRN

## 2021-02-24 NOTE — Telephone Encounter (Signed)
Patient called in says still has cough and congestin and fatigued. He says the medicine given by Dr Charlotta Newton isnt working. Please call back with any other suggestions   Pt. Reports he still not feeling well. Had visit 02/20/21 and prescribed medications. Still has chest congestion, cough, fatigue. Reports he is no better.States he was instructed to call back if no better. Please advise.

## 2021-02-24 NOTE — Telephone Encounter (Signed)
Called pt after receieving message that he wasn't feeling better Pt is  COVID +ve.  Still feels Very tired, cough non productive, if he does anything . Pt was out of the 5 day period to start anti virals when seen last.  Start pt on medrol dose pak. Cough is worse in the morning and at night, when he coughs he wants to get a hard cough to get it out but cannot cough hard enough. And Cheratussin . No fever.  Increase fluid intake.  Headahce - tyelnol every 4-6 hrs prn and alternate this with ibubrufen 800 mg q 8 hrly. Sinus pressure: use steam inhalation.  OTC -  Allegra / claritin.  Pt verbalized understanding of such Advised to call the office or go to the ER if he develops chest pain , any new onset of shortness of breath dizziness, or worsening symptoms.   Pt verbalized understanding of such.   Jason Frye

## 2021-02-24 NOTE — Telephone Encounter (Signed)
Spoke to pt and sent meds in

## 2021-03-07 ENCOUNTER — Ambulatory Visit: Payer: No Typology Code available for payment source | Admitting: Internal Medicine

## 2021-03-13 ENCOUNTER — Other Ambulatory Visit: Payer: No Typology Code available for payment source

## 2021-03-20 ENCOUNTER — Other Ambulatory Visit: Payer: Self-pay

## 2021-03-20 ENCOUNTER — Encounter: Payer: Self-pay | Admitting: Cardiology

## 2021-03-20 ENCOUNTER — Ambulatory Visit (INDEPENDENT_AMBULATORY_CARE_PROVIDER_SITE_OTHER): Payer: No Typology Code available for payment source | Admitting: Cardiology

## 2021-03-20 ENCOUNTER — Ambulatory Visit: Payer: No Typology Code available for payment source | Admitting: Internal Medicine

## 2021-03-20 VITALS — BP 112/66 | HR 66 | Ht 71.0 in | Wt 252.4 lb

## 2021-03-20 DIAGNOSIS — R011 Cardiac murmur, unspecified: Secondary | ICD-10-CM | POA: Diagnosis not present

## 2021-03-20 DIAGNOSIS — Z6835 Body mass index (BMI) 35.0-35.9, adult: Secondary | ICD-10-CM | POA: Diagnosis not present

## 2021-03-20 DIAGNOSIS — E782 Mixed hyperlipidemia: Secondary | ICD-10-CM | POA: Diagnosis not present

## 2021-03-20 NOTE — Progress Notes (Signed)
Cardiology Office Note:    Date:  03/20/2021   ID:  Jason Frye, DOB September 10, 1955, MRN [id number]  PCP:  Loura Pardon, MD   [facility] Providers Cardiologist:  None     Referring MD: Loura Pardon, MD   Chief Complaint  Patient presents with   Other    Diastolic Dysfunction/Discuss Echo. Meds reviewed verbally with pt.   Jason Frye is a 65 y.o. male who is being seen today for the evaluation of abnormal echocardiogram at the request of Vigg, Avanti, MD.   History of Present Illness:    Jason Frye is a 65 y.o. male with a hx of hyperlipidemia who presents due to abnormal echo.  Patient had an echocardiogram performed 01/25/2021, EF was noted to be normal, diastolic function read as impaired relaxation/grade 1 diastolic dysfunction.  He denies chest pain or shortness of breath.  Plays basketball at least once a week without any symptoms.  Also plays in the park with his grandkids without any symptoms.  During his echocardiogram procedure, his heart rates were noted to be elevated.  He has a history of a cardiac murmur.  He is working on losing weight, has lost 15 pounds over the past 2 months.  Past Medical History:  Diagnosis Date   Allergy    Arthritis    Glaucoma    Hyperlipidemia     History reviewed. No pertinent surgical history.  Current Medications: Current Meds  Medication Sig   B Complex Vitamins (VITAMIN B-COMPLEX) TABS Take 1 tablet by mouth daily.   meloxicam (MOBIC) 7.5 MG tablet    Multiple Vitamin (MULTI-VITAMINS) TABS Take by mouth.   simvastatin (ZOCOR) 40 MG tablet Take 1 tablet (40 mg total) by mouth every evening.   verapamil (CALAN-SR) 120 MG CR tablet Take 1 tablet (120 mg total) by mouth at bedtime.     Allergies:   Penicillins   Social History   Socioeconomic History   Marital status: Divorced    Spouse name: Not on file   Number of children: Not on file   Years of education: Not on file   Highest education level: Not on file   Occupational History   Not on file  Tobacco Use   Smoking status: Never   Smokeless tobacco: Never  Vaping Use   Vaping Use: Never used  Substance and Sexual Activity   Alcohol use: Yes    Comment: on occasion   Drug use: Never   Sexual activity: Not Currently  Other Topics Concern   Not on file  Social History Narrative   Not on file   Social Determinants of Health   Financial Resource Strain: Not on file  Food Insecurity: Not on file  Transportation Needs: Not on file  Physical Activity: Not on file  Stress: Not on file  Social Connections: Not on file     Family History: The patient's family history includes Breast cancer in his sister; Cancer in his father, mother, sister, and sister; Heart Problems in his mother, sister, and sister; Heart disease in his father and sister; Thyroid cancer in his sister.  ROS:   Please see the history of present illness.     All other systems reviewed and are negative.  EKGs/Labs/Other Studies Reviewed:    The following studies were reviewed today:   EKG:  EKG is  ordered today.  The ekg ordered today demonstrates normal sinus rhythm, normal ECG  Recent Labs: 01/04/2021: ALT 32; BUN 22; Creatinine, Ser 1.16; Hemoglobin 14.6;  Platelets 264; Potassium 4.2; Sodium 141; TSH 2.820  Recent Lipid Panel    Component Value Date/Time   CHOL 173 01/04/2021 1004   TRIG 202 (H) 01/04/2021 1004   HDL 39 (L) 01/04/2021 1004   CHOLHDL 4.4 01/04/2021 1004   LDLCALC 99 01/04/2021 1004     Risk Assessment/Calculations:          Physical Exam:    VS:  BP 112/66 (BP Location: Right Arm, Patient Position: Sitting, Cuff Size: Large)   Pulse 66   Ht 5\' 11"  (1.803 m)   Wt 252 lb 6 oz (114.5 kg)   SpO2 98%   BMI 35.20 kg/m     Wt Readings from Last 3 Encounters:  03/20/21 252 lb 6 oz (114.5 kg)  01/25/21 255 lb 6.4 oz (115.8 kg)  01/04/21 265 lb 12.8 oz (120.6 kg)     GEN:  Well nourished, well developed in no acute distress HEENT:  Normal NECK: No JVD; No carotid bruits LYMPHATICS: No lymphadenopathy CARDIAC: RRR, 1-2/6 systolic murmur RESPIRATORY:  Clear to auscultation without rales, wheezing or rhonchi  ABDOMEN: Soft, non-tender, non-distended MUSCULOSKELETAL:  No edema; No deformity  SKIN: Warm and dry NEUROLOGIC:  Alert and oriented x 3 PSYCHIATRIC:  Normal affect   ASSESSMENT:    1. Systolic murmur   2. Mixed hyperlipidemia   3. BMI 35.0-35.9,adult    PLAN:    In order of problems listed above:  Systolic murmur, mild aortic valve calcification with no significant stenosis noted on echocardiogram.  EF normal, impaired relaxation/grade 1 diastolic dysfunction.  Patient made aware of results, impaired relaxation likely from obesity, hyperlipidemia.  Has no cardiac symptoms.  Management of risk factors including obesity, hyperlipidemia will help with diastolic dysfunction.  No additional cardiac testing indicated at this time. Hyperlipidemia, low calorie diet, weight loss advised.  Continue simvastatin. Obesity, encouraged on current weight loss.  Advised to continue diet and exercise.  Follow-up as needed     Medication Adjustments/Labs and Tests Ordered: Current medicines are reviewed at length with the patient today.  Concerns regarding medicines are outlined above.  Orders Placed This Encounter  Procedures   EKG 12-Lead   No orders of the defined types were placed in this encounter.   Patient Instructions  Medication Instructions:  Your physician recommends that you continue on your current medications as directed. Please refer to the Current Medication list given to you today.  *If you need a refill on your cardiac medications before your next appointment, please call your pharmacy*   Lab Work: None ordered If you have labs (blood work) drawn today and your tests are completely normal, you will receive your results only by: MyChart Message (if you have MyChart) OR A paper copy in the  mail If you have any lab test that is abnormal or we need to change your treatment, we will call you to review the results.   Testing/Procedures: None ordered   Follow-Up: At [facility], you and your health needs are our priority.  As part of our continuing mission to provide you with exceptional heart care, we have created designated Provider Care Teams.  These Care Teams include your primary Cardiologist (physician) and Advanced Practice Providers (APPs -  Physician Assistants and Nurse Practitioners) who all work together to provide you with the care you need, when you need it.  We recommend signing up for the patient portal called "MyChart".  Sign up information is provided on this After Visit Summary.  MyChart  is used to connect with patients for Virtual Visits (Telemedicine).  Patients are able to view lab/test results, encounter notes, upcoming appointments, etc.  Non-urgent messages can be sent to your provider as well.   To learn more about what you can do with MyChart, go to [email or web address].    Your next appointment:   Follow up as needed   The format for your next appointment:   In Person  Provider:   Debbe Odea, MD   Other Instructions    Signed, Debbe Odea, MD  03/20/2021 5:20 PM    [facility]

## 2021-03-20 NOTE — Patient Instructions (Signed)

## 2021-03-29 ENCOUNTER — Other Ambulatory Visit: Payer: Self-pay | Admitting: Neurosurgery

## 2021-03-29 DIAGNOSIS — M5416 Radiculopathy, lumbar region: Secondary | ICD-10-CM

## 2021-03-29 DIAGNOSIS — M4810 Ankylosing hyperostosis [Forestier], site unspecified: Secondary | ICD-10-CM

## 2021-03-29 DIAGNOSIS — G959 Disease of spinal cord, unspecified: Secondary | ICD-10-CM

## 2021-04-04 ENCOUNTER — Other Ambulatory Visit: Payer: No Typology Code available for payment source

## 2021-04-04 ENCOUNTER — Other Ambulatory Visit: Payer: Self-pay

## 2021-04-04 DIAGNOSIS — E785 Hyperlipidemia, unspecified: Secondary | ICD-10-CM

## 2021-04-04 DIAGNOSIS — R5383 Other fatigue: Secondary | ICD-10-CM

## 2021-04-05 LAB — LIPID PANEL W/O CHOL/HDL RATIO
Cholesterol, Total: 192 mg/dL (ref 100–199)
HDL: 36 mg/dL — ABNORMAL LOW (ref >39)
LDL Chol Calc (NIH): 121 mg/dL — ABNORMAL HIGH (ref 0–99)
Triglycerides: 200 mg/dL — ABNORMAL HIGH (ref 0–149)
VLDL Cholesterol Cal: 35 mg/dL (ref 5–40)

## 2021-04-05 LAB — TSH: TSH: 2.83 u[IU]/mL (ref 0.450–4.500)

## 2021-04-05 LAB — HEMOGLOBIN A1C
Est. average glucose Bld gHb Est-mCnc: 120 mg/dL
Hgb A1c MFr Bld: 5.8 % — ABNORMAL HIGH (ref 4.8–5.6)

## 2021-04-09 ENCOUNTER — Ambulatory Visit: Payer: No Typology Code available for payment source

## 2021-04-10 ENCOUNTER — Telehealth: Payer: Self-pay

## 2021-04-10 NOTE — Telephone Encounter (Signed)
Pt. Calling to reschedule colonoscopy. He says that he does not have anyone to drive him home.

## 2021-04-11 ENCOUNTER — Other Ambulatory Visit: Payer: Self-pay

## 2021-04-11 ENCOUNTER — Ambulatory Visit: Payer: No Typology Code available for payment source | Admitting: Nurse Practitioner

## 2021-04-11 NOTE — Telephone Encounter (Signed)
Procedure has been rescheduled for 06/16/21.

## 2021-04-11 NOTE — Progress Notes (Signed)
Patient has requested to reschedule procedure for 06/16/21.

## 2021-04-17 ENCOUNTER — Ambulatory Visit: Payer: No Typology Code available for payment source | Admitting: Internal Medicine

## 2021-04-20 ENCOUNTER — Telehealth: Payer: Self-pay | Admitting: Internal Medicine

## 2021-04-20 NOTE — Telephone Encounter (Signed)
Lmom to advise that we need to reschedule appointment scheduled for 9/23 due to provider out of the office.   

## 2021-04-21 ENCOUNTER — Ambulatory Visit: Payer: No Typology Code available for payment source | Admitting: Internal Medicine

## 2021-04-21 NOTE — Telephone Encounter (Signed)
Rescheduled patient appointment

## 2021-05-08 ENCOUNTER — Ambulatory Visit
Admission: RE | Admit: 2021-05-08 | Discharge: 2021-05-08 | Disposition: A | Discharge status: Home / Self Care | Payer: No Typology Code available for payment source | Source: Ambulatory Visit | Attending: Neurosurgery | Admitting: Neurosurgery

## 2021-05-08 ENCOUNTER — Other Ambulatory Visit: Payer: Self-pay

## 2021-05-08 DIAGNOSIS — G959 Disease of spinal cord, unspecified: Secondary | ICD-10-CM | POA: Diagnosis present

## 2021-05-08 DIAGNOSIS — M4810 Ankylosing hyperostosis [Forestier], site unspecified: Secondary | ICD-10-CM

## 2021-05-08 DIAGNOSIS — M5416 Radiculopathy, lumbar region: Secondary | ICD-10-CM

## 2021-05-08 IMAGING — MR MR THORACIC SPINE W/O CM
6 series · 30 of 48 positions shown · non-contrast
Comparison: None.

CLINICAL DATA: Diffuse idiopathic skeletal hyperostosis.
Myelopathy. Chronic low back pain radiating to the right thigh.

EXAM:
MRI THORACIC SPINE WITHOUT CONTRAST
TECHNIQUE: Multiplanar, multisequence MR imaging of the thoracic spine was
performed. No intravenous contrast was administered.

[Series 18: T1 · sagittal · 6.0mm · 1.88mm/px · 2 of 9 slices shown (1 of 2)]
[im 1/9]
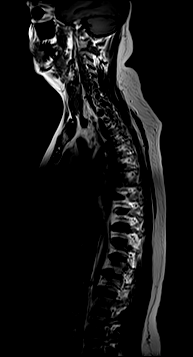
[im 9/9]
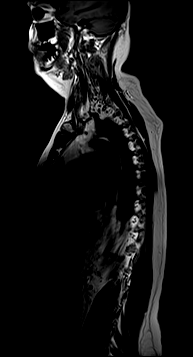

[Series 19: T2 · sagittal · 3.0mm · 1.06mm/px · 6 of 17 slices shown (1 of 2)]
[im 1/17]
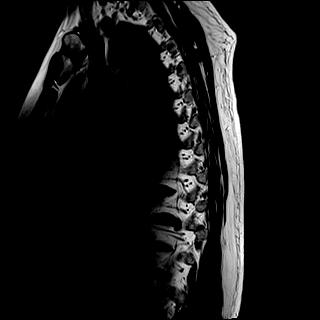
[im 4/17]
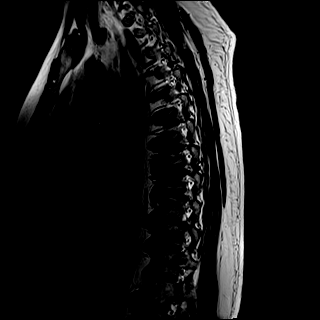
[im 7/17]
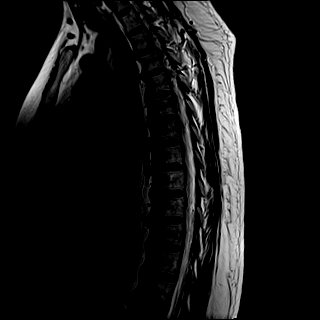
[im 10/17]
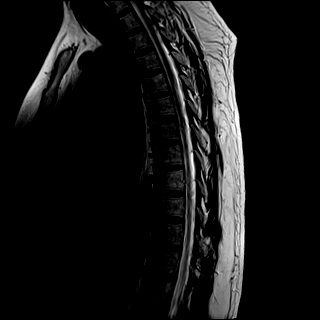
[im 13/17]
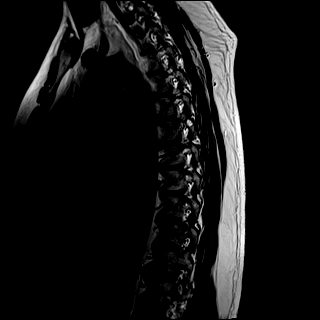
[im 17/17]
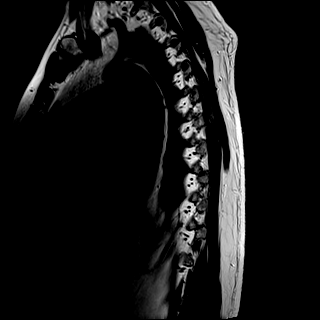

[Series 20: T1 · sagittal · 3.0mm · 1.06mm/px · 6 of 17 slices shown (2 of 2)]
[im 1/17]
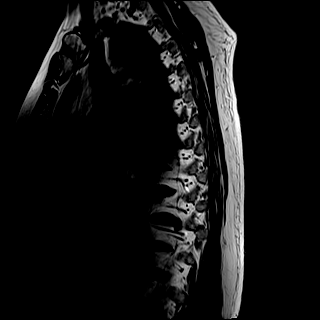
[im 4/17]
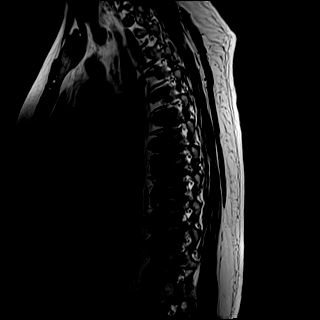
[im 7/17]
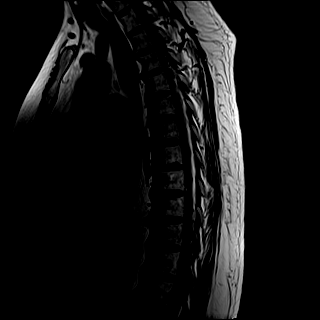
[im 10/17]
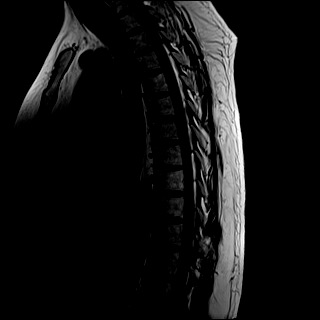
[im 13/17]
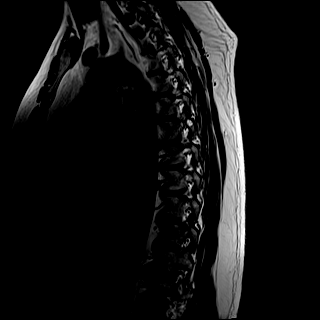
[im 17/17]
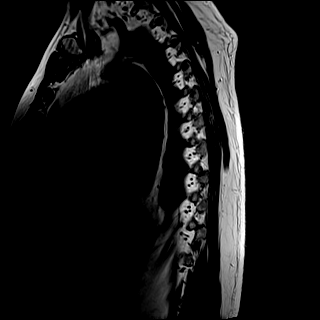

[Series 21: STIR · sagittal · 3.0mm · 0.53mm/px · 6 of 17 slices shown]
[im 1/17]
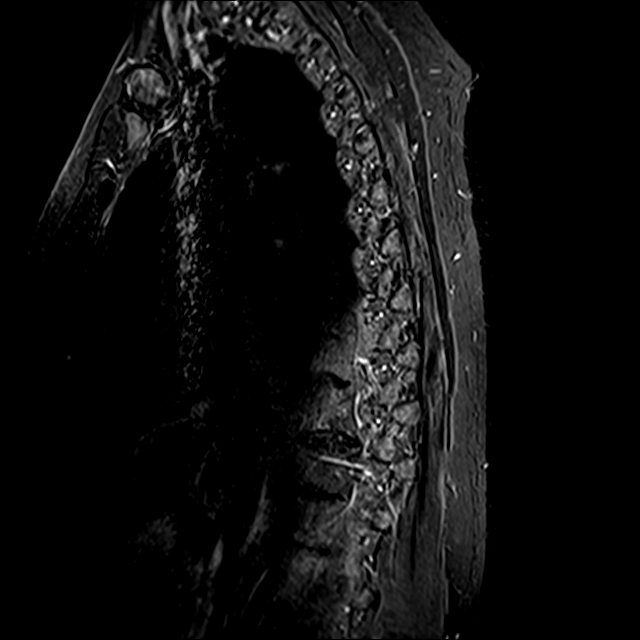
[im 4/17]
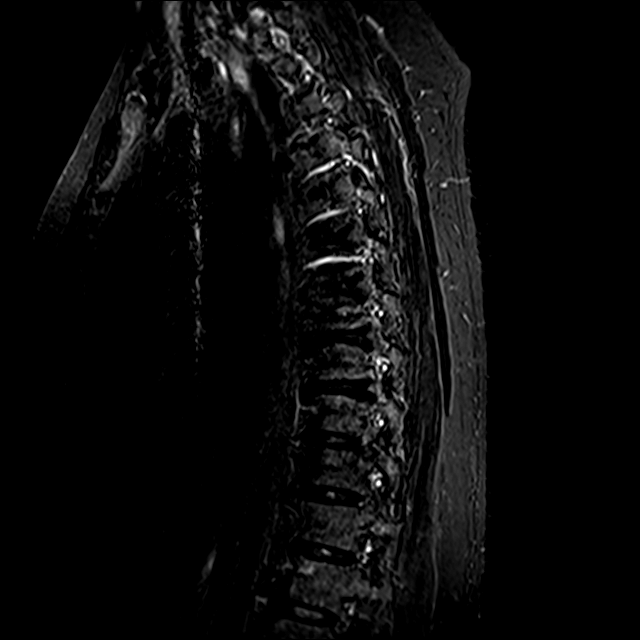
[im 7/17]
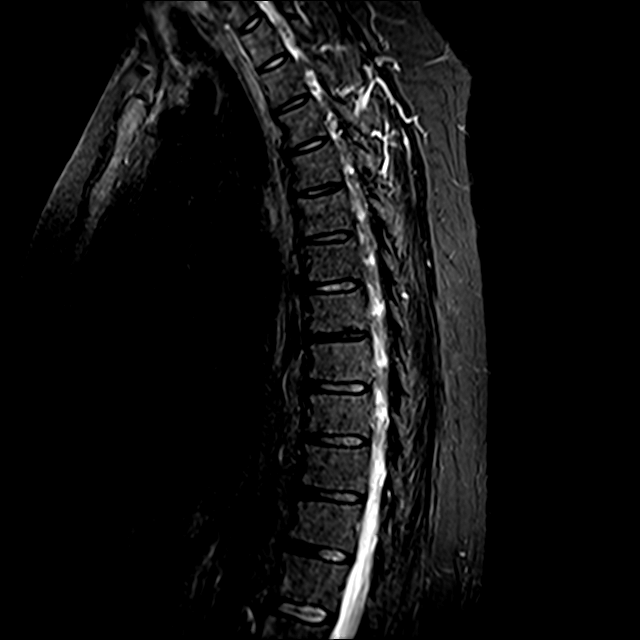
[im 10/17]
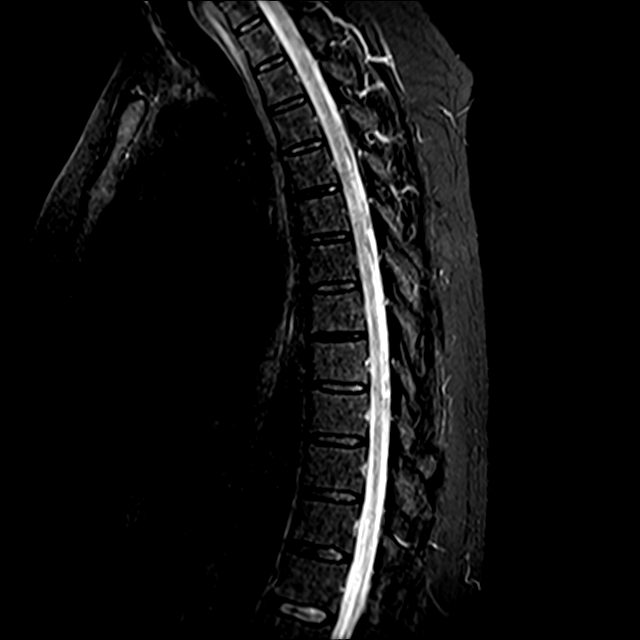
[im 13/17]
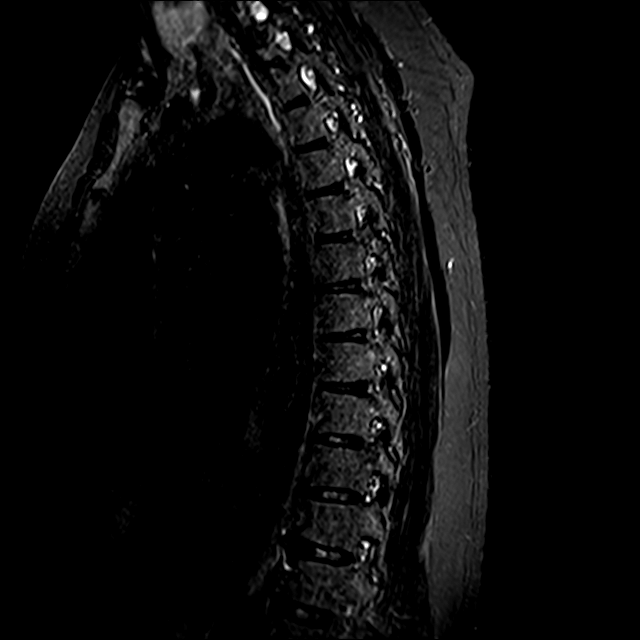
[im 17/17]
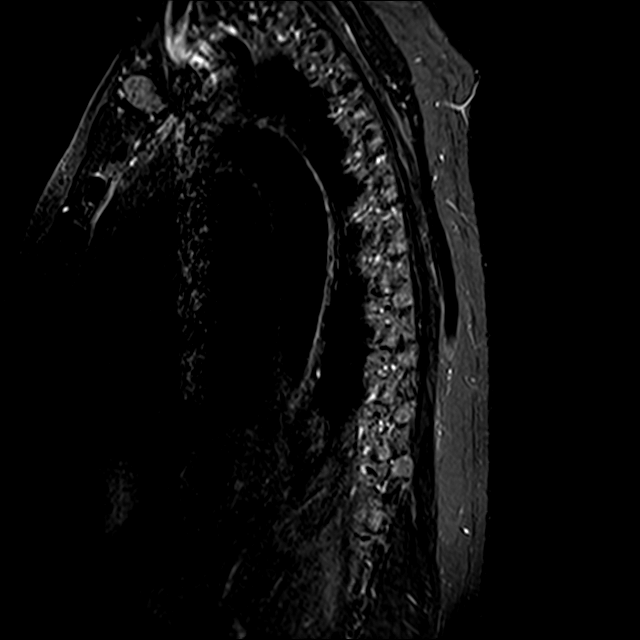

[Series 22: T2 · axial · 4.0mm · 0.59mm/px · z∈[-369,-107]mm · 8 of 39 slices shown (2 of 2)]
[im 1/39]
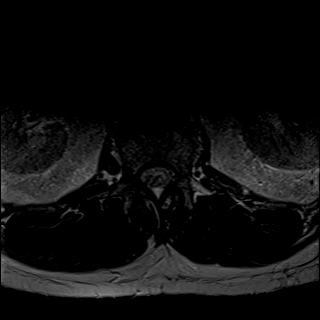
[im 6/39]
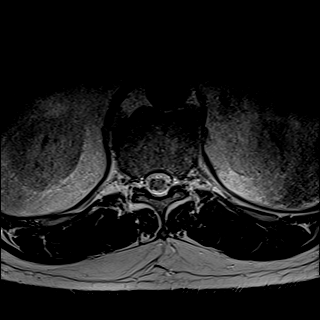
[im 12/39]
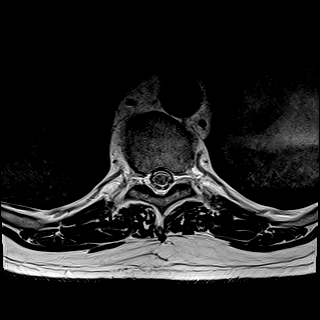
[im 18/39]
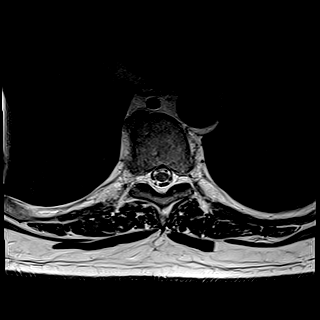
[im 21/39]
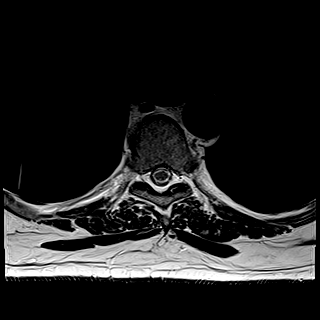
[im 27/39]
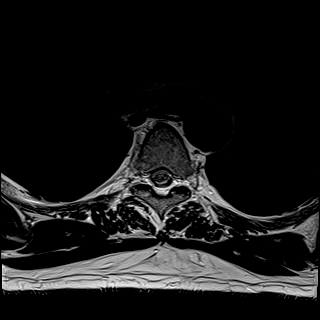
[im 33/39]
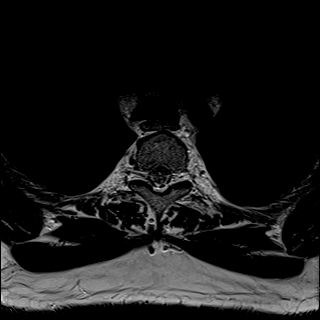
[im 39/39]
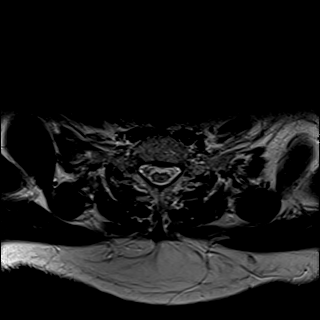

[Series 23: GRE · axial · 4.0mm · 0.37mm/px · z∈[-369,-331]mm · 2 of 39 slices shown]
[im 1/39]
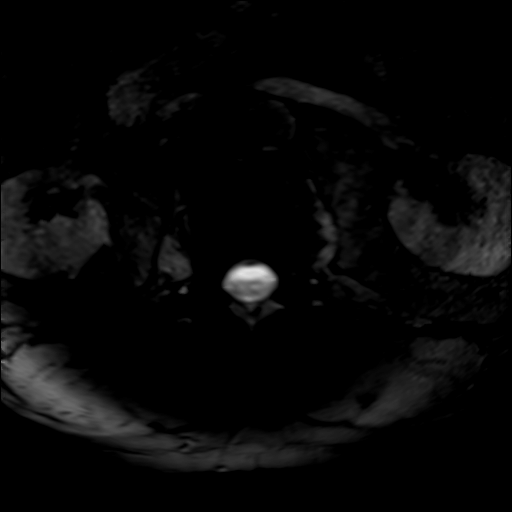
[im 6/39]
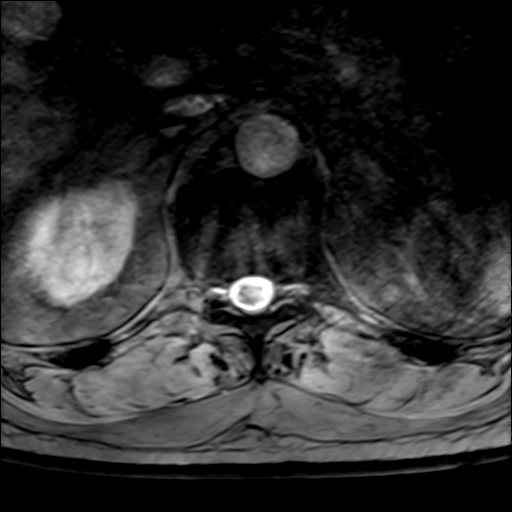

[30 of 48 positions shown; findings below may reference images not displayed]

FINDINGS: Alignment:  Normal.

Vertebrae: No fracture, suspicious marrow lesion, or significant
marrow edema.

Cord:  Normal signal and morphology.

Paraspinal and other soft tissues: Unremarkable.

Disc levels:

Spondylosis with predominantly right-sided anterolateral vertebral
osteophytes throughout the thoracic spine, largest at T9-10. Mild
facet arthrosis at most levels in the thoracic spine. No disc
herniation, spinal stenosis, or neural foraminal stenosis.
IMPRESSION: Mild thoracic spondylosis and facet arthrosis without stenosis.

## 2021-05-08 IMAGING — MR MR LUMBAR SPINE W/O CM
5 series · 31 of 48 positions shown · non-contrast
Comparison: None.

CLINICAL DATA: Diffuse idiopathic skeletal hyperostosis. Lumbar
radiculopathy. Chronic low back pain radiating to the right thigh.

EXAM:
MRI LUMBAR SPINE WITHOUT CONTRAST
TECHNIQUE: Multiplanar, multisequence MR imaging of the lumbar spine was
performed. No intravenous contrast was administered.

[Series 16: T2 · sagittal · 4.0mm · 0.81mm/px · 6 of 17 slices shown (1 of 2)]
[im 1/17]
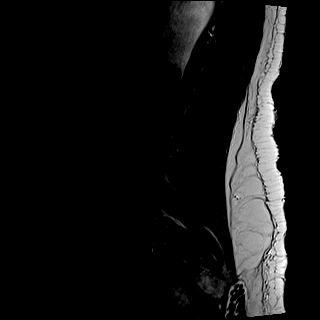
[im 4/17]
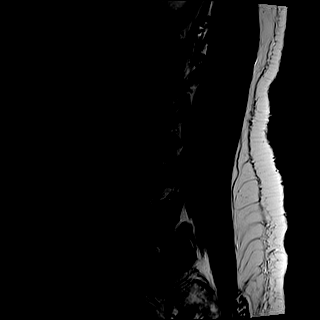
[im 7/17]
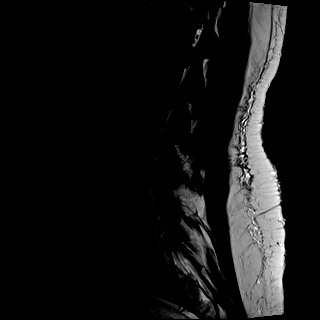
[im 10/17]
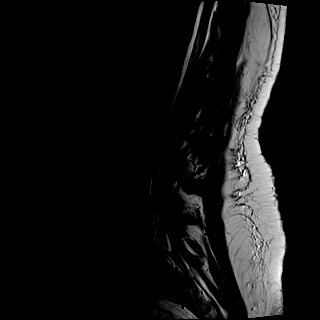
[im 13/17]
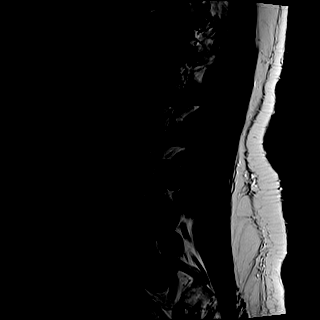
[im 17/17]
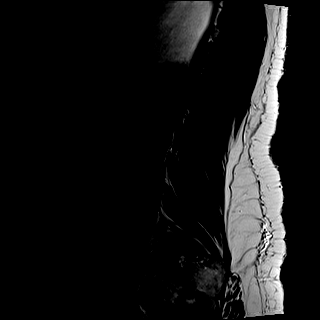

[Series 17: T1 · sagittal · 4.0mm · 0.81mm/px · 6 of 17 slices shown (1 of 2)]
[im 1/17]
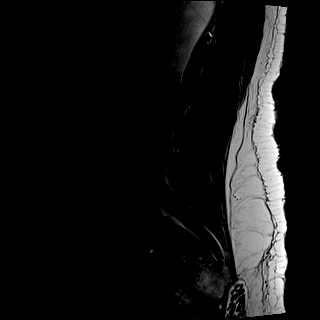
[im 4/17]
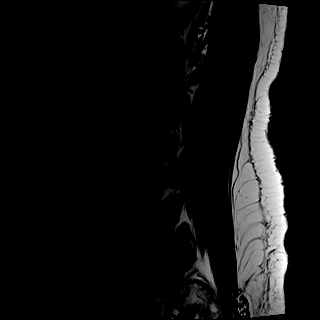
[im 7/17]
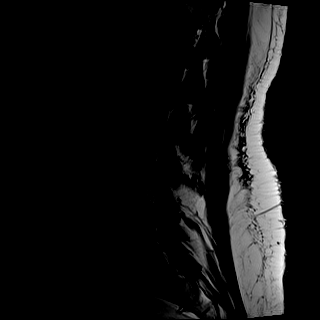
[im 10/17]
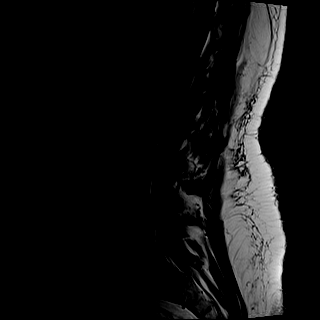
[im 13/17]
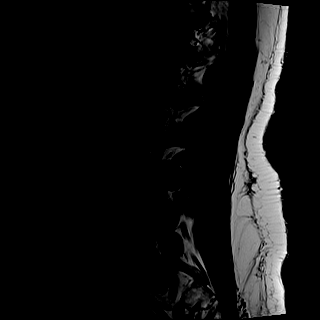
[im 17/17]
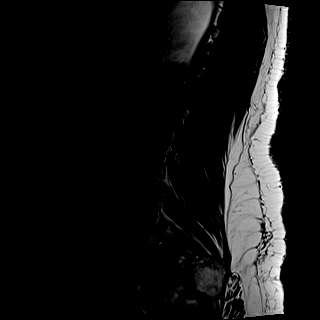

[Series 18: STIR · sagittal · 4.0mm · 0.41mm/px · 1 of 17 slices shown]
[im 1/17]
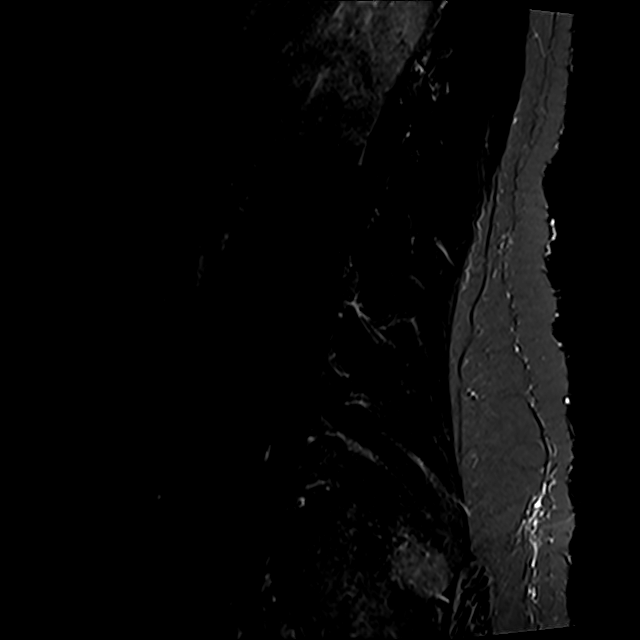

[Series 19: T2 · axial · 4.0mm · 0.78mm/px · z∈[-189,+42]mm · 9 of 39 slices shown (2 of 2)]
[im 1/39]
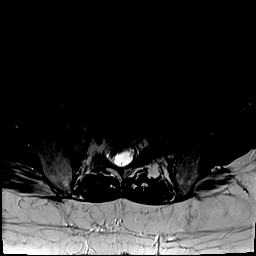
[im 6/39]
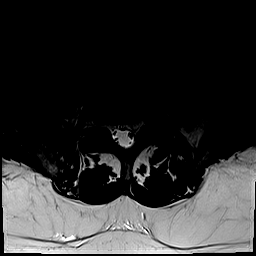
[im 11/39]
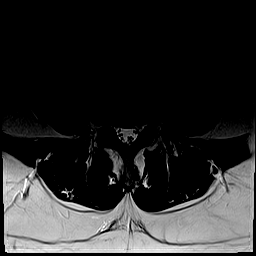
[im 17/39]
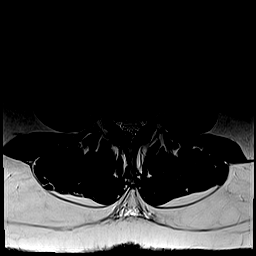
[im 20/39]
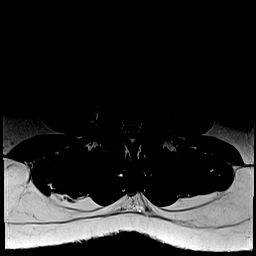
[im 22/39]
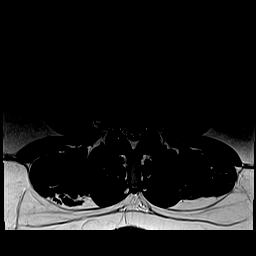
[im 28/39]
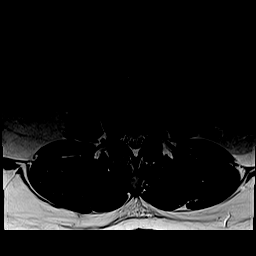
[im 33/39]
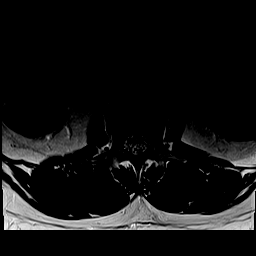
[im 39/39]
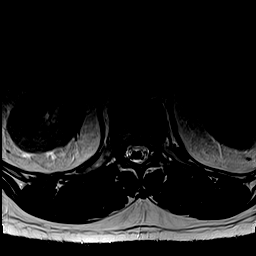

[Series 20: T1 · axial · 4.0mm · 0.39mm/px · z∈[-189,+42]mm · 9 of 39 slices shown (2 of 2)]
[im 1/39]
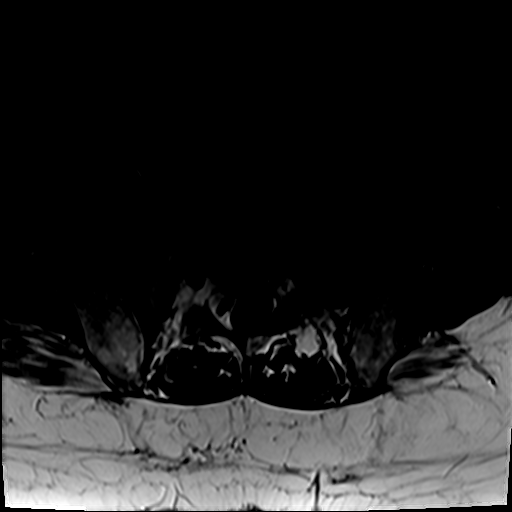
[im 6/39]
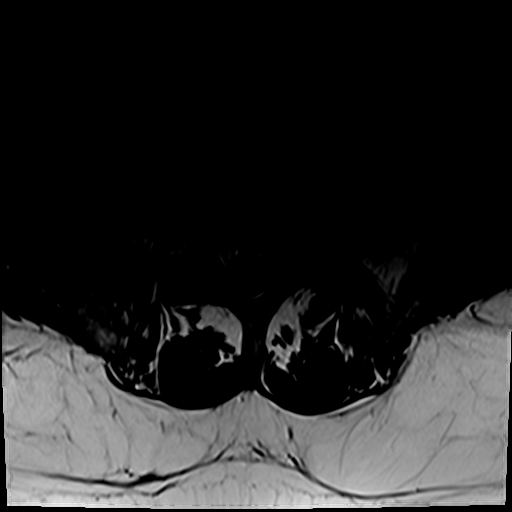
[im 11/39]
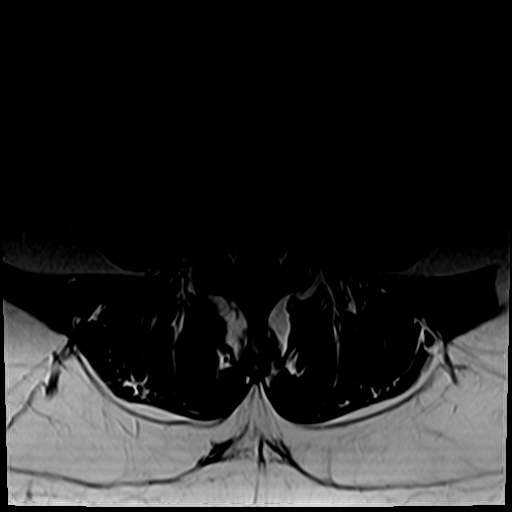
[im 17/39]
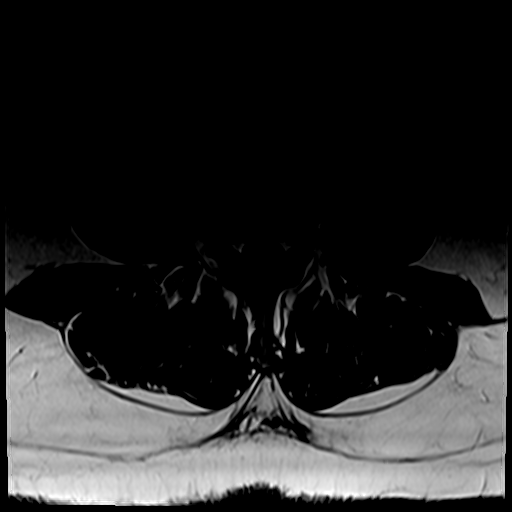
[im 20/39]
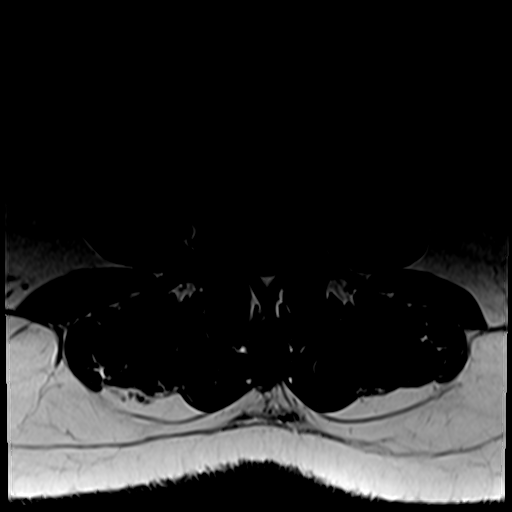
[im 22/39]
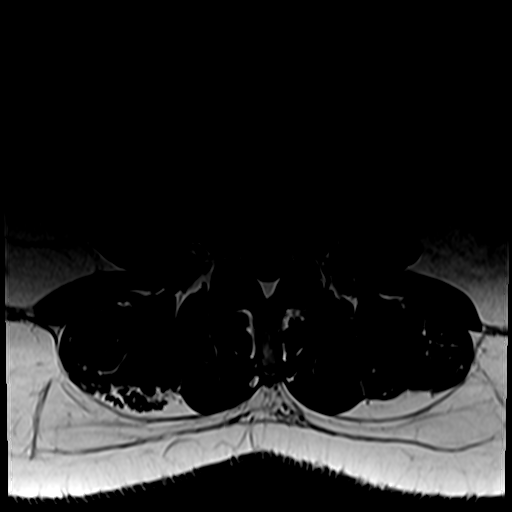
[im 28/39]
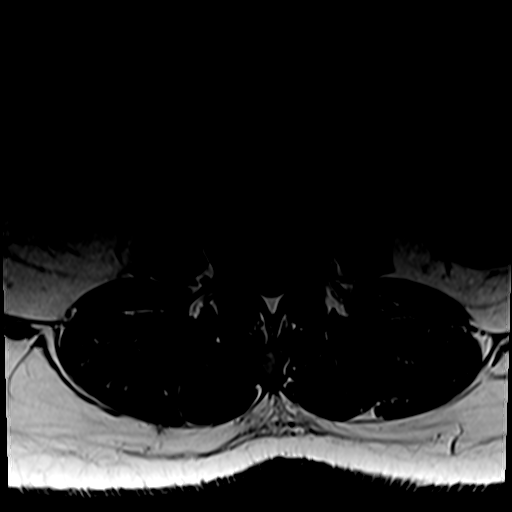
[im 33/39]
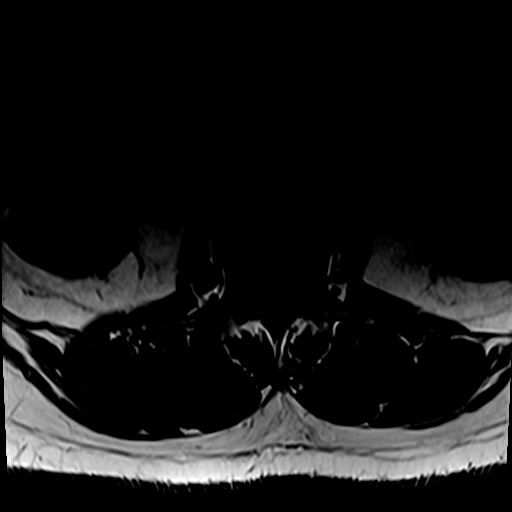
[im 39/39]
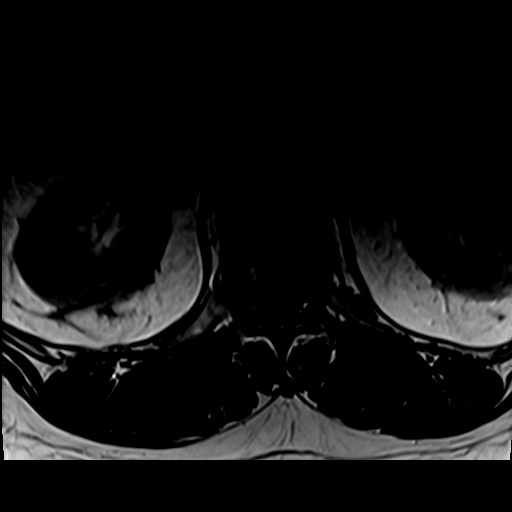

[31 of 48 positions shown; findings below may reference images not displayed]

FINDINGS: Segmentation:  Standard.

Alignment:  Normal.

Vertebrae: No fracture, suspicious marrow lesion, or significant
marrow edema.

Conus medullaris and cauda equina: Conus extends to the L1 level.
Conus and cauda equina appear normal.

Paraspinal and other soft tissues: Unremarkable.

Disc levels:

Disc desiccation greatest at L4-5.  Preserved disc space heights.

L1-2: Negative.

L2-3: Mild disc bulging, mild endplate spurring, and mild facet
hypertrophy result in borderline bilateral lateral recess and
borderline left neural foraminal stenosis without spinal stenosis.

L3-4: Disc bulging, endplate spurring, and mild facet and ligamentum
flavum hypertrophy result in mild bilateral lateral recess stenosis
and mild bilateral neural foraminal stenosis without spinal
stenosis.

L4-5: Disc bulging, endplate spurring, and mild facet and ligamentum
flavum hypertrophy result in mild bilateral lateral recess stenosis
and mild bilateral neural foraminal stenosis without spinal
stenosis. Small central annular fissure.

L5-S1: Minimal disc bulging, endplate spurring, mild right and
severe left facet and ligamentum flavum hypertrophy, and a 3 mm
synovial cyst anterior to the left facet joint extending into the
posterolateral aspect of the left neural foramen result in mild left
neural foraminal stenosis. There is a shallow central disc
protrusion without spinal stenosis.
IMPRESSION: 1. Mild lumbar spondylosis with up to mild lateral recess and neural
foraminal stenosis as above.
2. Lower lumbar facet arthrosis, severe on the left at L5-S1.

## 2021-05-08 IMAGING — MR MR CERVICAL SPINE W/O CM
5 series · 39 of 48 positions shown · non-contrast
Comparison: None.

CLINICAL DATA: Diffuse idiopathic skeletal hyperostosis.
Myelopathy. Chronic low back pain radiating to the right thigh.

EXAM:
MRI CERVICAL SPINE WITHOUT CONTRAST
TECHNIQUE: Multiplanar, multisequence MR imaging of the cervical spine was
performed. No intravenous contrast was administered.

[Series 16: T2 · sagittal · 3.0mm · 0.62mm/px · 7 of 15 slices shown (1 of 2)]
[im 1/15]
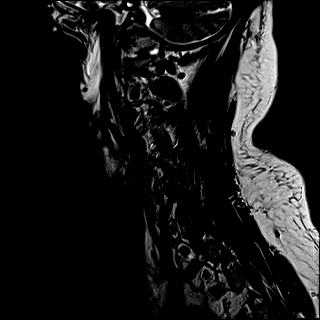
[im 3/15]
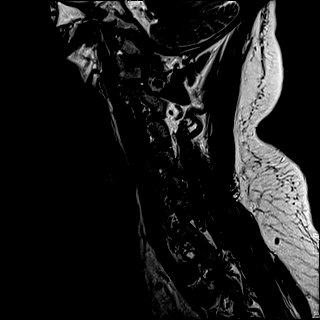
[im 5/15]
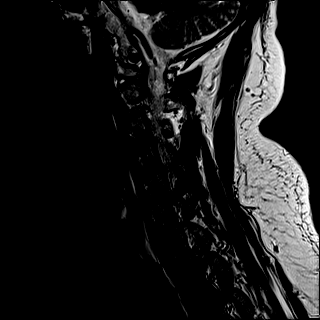
[im 8/15]
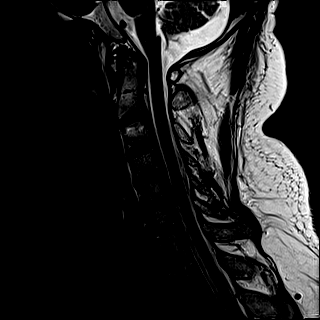
[im 10/15]
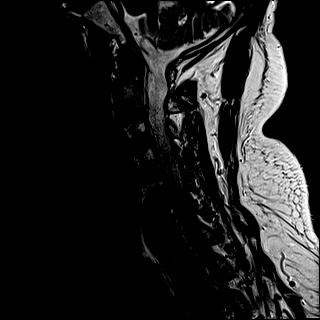
[im 12/15]
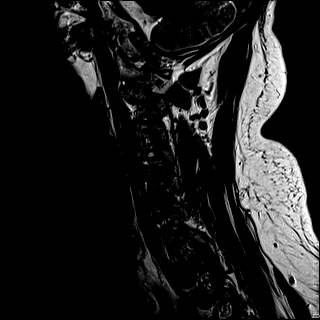
[im 15/15]
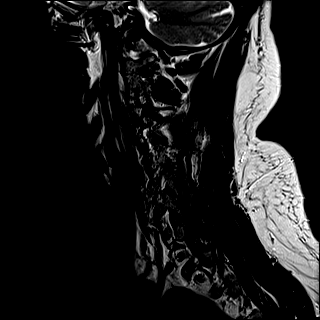

[Series 17: FLAIR · sagittal · 3.0mm · 0.78mm/px · 7 of 15 slices shown]
[im 1/15]
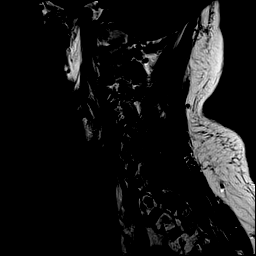
[im 3/15]
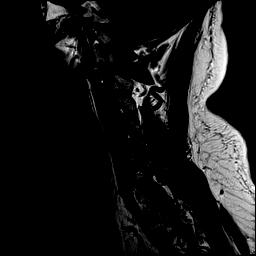
[im 5/15]
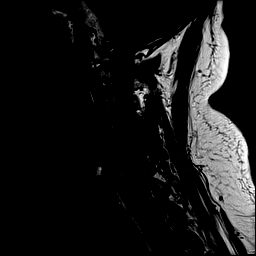
[im 8/15]
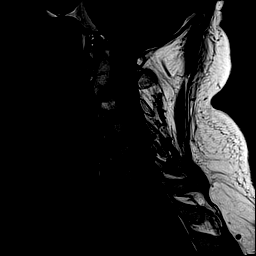
[im 10/15]
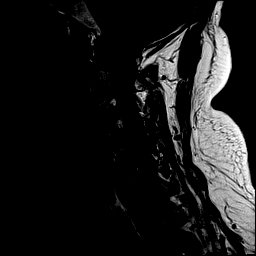
[im 12/15]
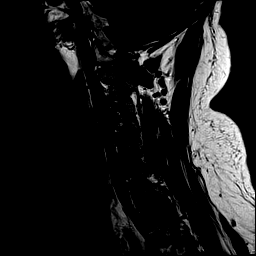
[im 15/15]
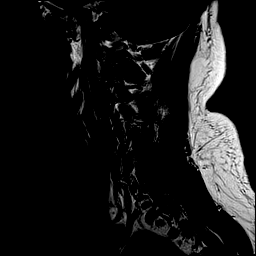

[Series 18: STIR · sagittal · 3.0mm · 0.62mm/px · 6 of 15 slices shown]
[im 1/15]
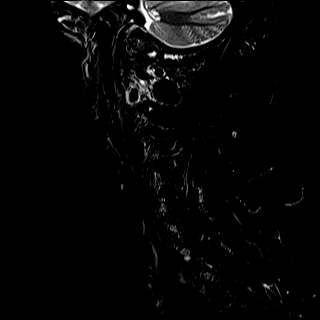
[im 3/15]
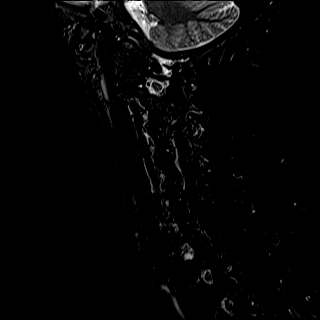
[im 6/15]
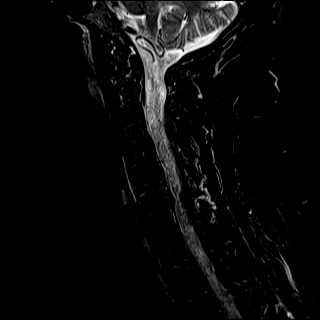
[im 9/15]
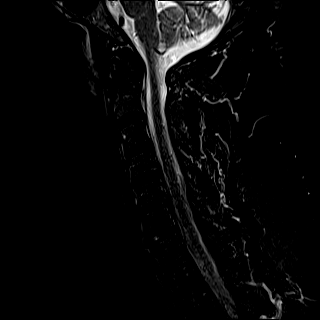
[im 12/15]
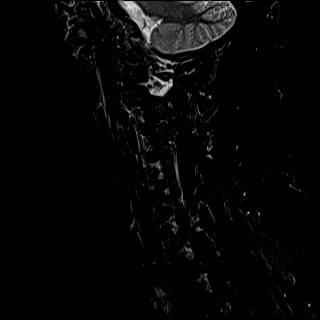
[im 15/15]
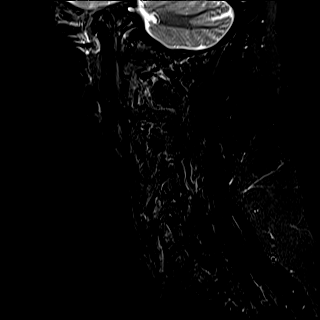

[Series 19: T2 · axial · 3.0mm · 0.70mm/px · z∈[-90,+20]mm · 11 of 33 slices shown (2 of 2)]
[im 1/33]
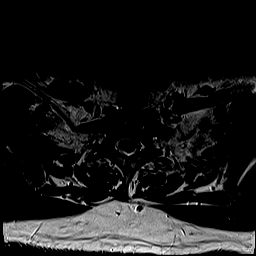
[im 3/33]
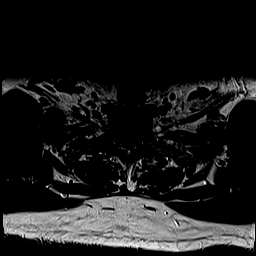
[im 5/33]
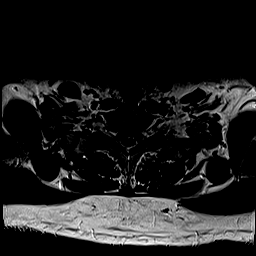
[im 8/33]
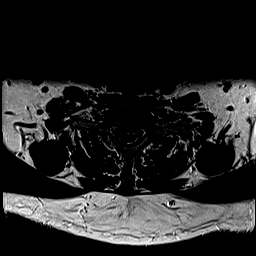
[im 10/33]
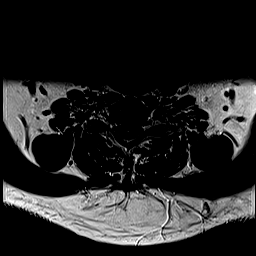
[im 13/33]
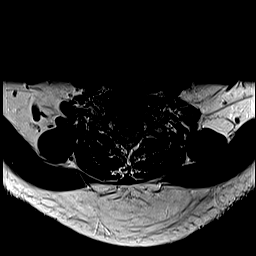
[im 15/33]
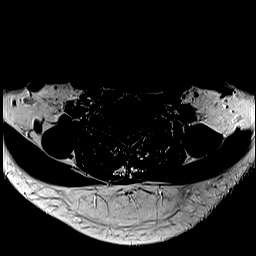
[im 18/33]
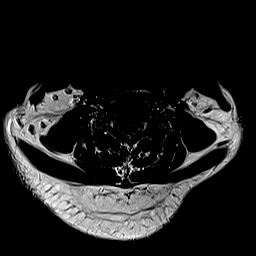
[im 23/33]
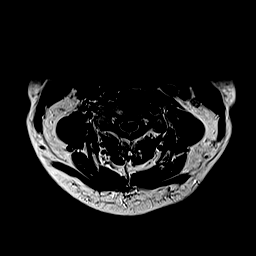
[im 28/33]
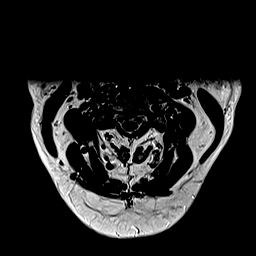
[im 33/33]
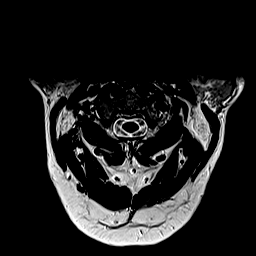

[Series 20: ax mpgr · axial · 3.0mm · 0.35mm/px · z∈[-90,+20]mm · 8 of 33 slices shown]
[im 1/33]
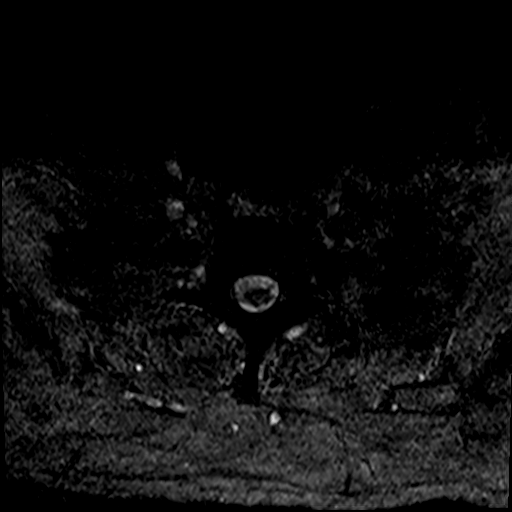
[im 5/33]
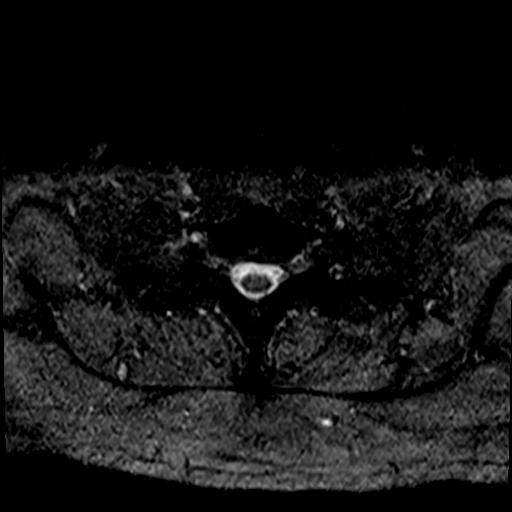
[im 10/33]
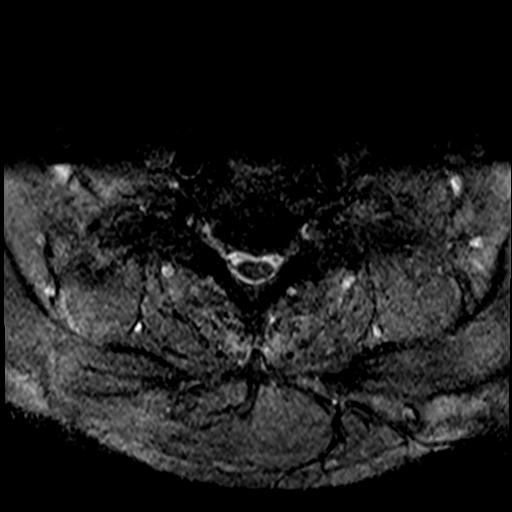
[im 15/33]
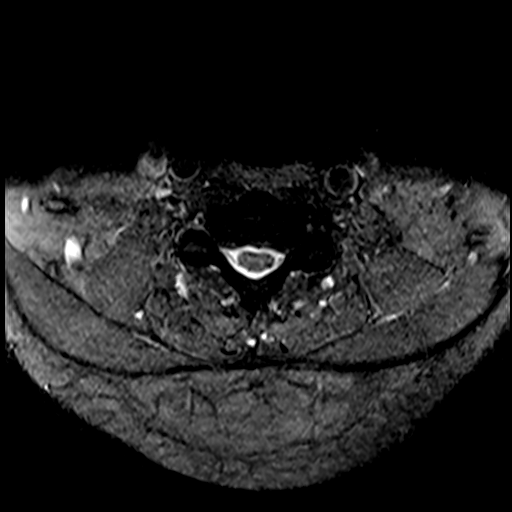
[im 18/33]
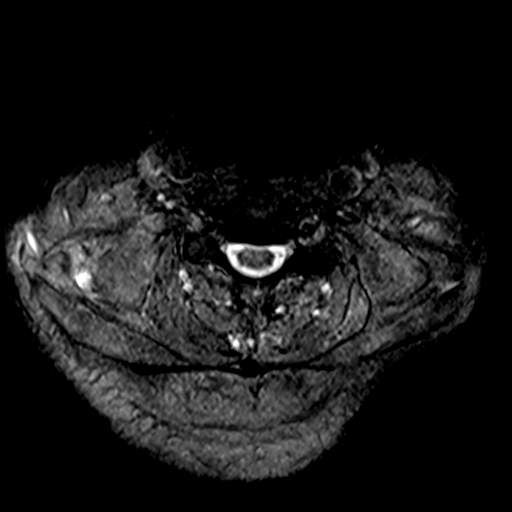
[im 23/33]
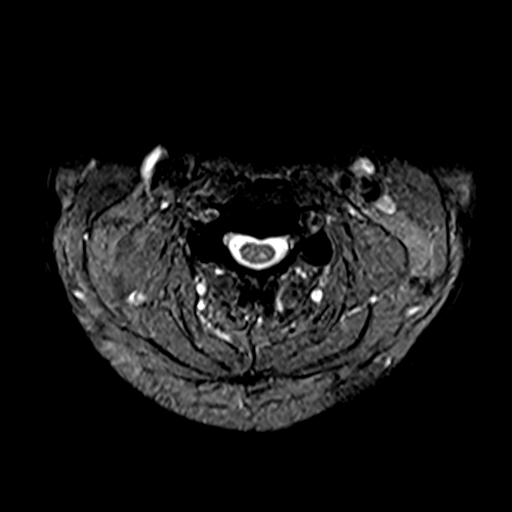
[im 28/33]
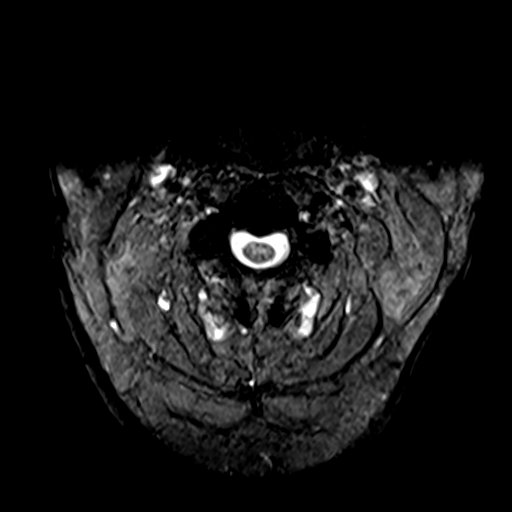
[im 33/33]
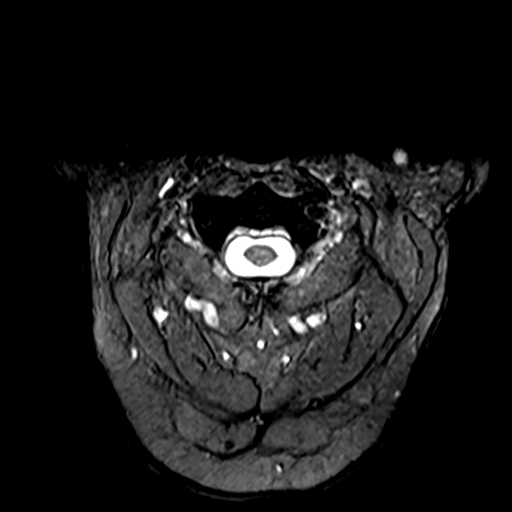

[39 of 48 positions shown; findings below may reference images not displayed]

FINDINGS: Alignment: Straightening of the normal cervical lordosis. No
significant listhesis.

Vertebrae: No fracture, suspicious marrow lesion, or significant
marrow edema.

Cord: Normal signal and morphology.

Posterior Fossa, vertebral arteries, paraspinal tissues:
Unremarkable.

Disc levels:

C2-3: Minimal uncovertebral spurring and mild right facet arthrosis
without significant stenosis.

C3-4: Tiny central disc protrusion, mild uncovertebral spurring, and
mild facet arthrosis without significant stenosis.

C4-5: Uncovertebral spurring and mild-to-moderate left facet
arthrosis result in mild bilateral neural foraminal stenosis without
spinal stenosis.

C5-6: Disc bulging and uncovertebral spurring result in mild spinal
stenosis and severe left greater than right neural foraminal
stenosis with potential bilateral C6 nerve root impingement.

C6-7: A central disc protrusion results in borderline spinal
stenosis. Patent neural foramina.

C7-T1: Minimal facet arthrosis without disc herniation or stenosis.
IMPRESSION: Multilevel cervical disc degeneration, greatest at C5-6 where there
is mild spinal stenosis and severe bilateral neural foraminal
stenosis.

## 2021-05-09 ENCOUNTER — Ambulatory Visit: Payer: No Typology Code available for payment source | Admitting: Internal Medicine

## 2021-05-11 ENCOUNTER — Other Ambulatory Visit: Payer: Self-pay

## 2021-05-11 ENCOUNTER — Ambulatory Visit (INDEPENDENT_AMBULATORY_CARE_PROVIDER_SITE_OTHER): Payer: No Typology Code available for payment source | Admitting: Internal Medicine

## 2021-05-11 ENCOUNTER — Encounter: Payer: Self-pay | Admitting: Internal Medicine

## 2021-05-11 VITALS — BP 116/70 | HR 66 | Temp 98.6°F | Ht 70.98 in | Wt 247.7 lb

## 2021-05-11 DIAGNOSIS — E785 Hyperlipidemia, unspecified: Secondary | ICD-10-CM

## 2021-05-11 DIAGNOSIS — I10 Essential (primary) hypertension: Secondary | ICD-10-CM | POA: Diagnosis not present

## 2021-05-11 DIAGNOSIS — M4810 Ankylosing hyperostosis [Forestier], site unspecified: Secondary | ICD-10-CM

## 2021-05-11 MED ORDER — ROSUVASTATIN CALCIUM 10 MG PO TABS: 10.0000 mg | ORAL_TABLET | Freq: Every day | ORAL | 3 refills | Status: DC

## 2021-05-11 NOTE — Progress Notes (Signed)
BP 116/70   Pulse 66   Temp 98.6 F (37 C) (Oral)   Ht 5' 10.98" (1.803 m)   Wt 247 lb 11.2 oz (112.4 kg)   SpO2 96%   BMI 34.56 kg/m    Subjective:    Patient ID: Shayne Diguglielmo, male    DOB: 04/21/1956, 65 y.o.   MRN: [id number]  Chief Complaint  Patient presents with  . Medication Management    Patient is here for Blood Pressure Medication Follow Up. Patient states he has been taking half a dose of his current medication. Patient states the heart rate and his blood pressure have been doing well.     HPI: Donnovan Stamour is a 65 y.o. male  Hypertension This is a chronic problem. The current episode started more than 1 month ago. The problem has been rapidly improving since onset. The problem is controlled. Pertinent negatives include no anxiety, chest pain, malaise/fatigue, neck pain or orthopnea.  Hyperlipidemia This is a chronic problem. The current episode started more than 1 month ago. The problem is uncontrolled. Pertinent negatives include no chest pain.   Chief Complaint  Patient presents with  . Medication Management    Patient is here for Blood Pressure Medication Follow Up. Patient states he has been taking half a dose of his current medication. Patient states the heart rate and his blood pressure have been doing well.     Relevant past medical, surgical, family and social history reviewed and updated as indicated. Interim medical history since our last visit reviewed. Allergies and medications reviewed and updated.  Review of Systems  Constitutional:  Negative for malaise/fatigue.  Cardiovascular:  Negative for chest pain and orthopnea.  Musculoskeletal:  Negative for neck pain.   Per HPI unless specifically indicated above     Objective:    BP 116/70   Pulse 66   Temp 98.6 F (37 C) (Oral)   Ht 5' 10.98" (1.803 m)   Wt 247 lb 11.2 oz (112.4 kg)   SpO2 96%   BMI 34.56 kg/m   Wt Readings from Last 3 Encounters:  05/11/21 247 lb 11.2 oz (112.4 kg)   03/20/21 252 lb 6 oz (114.5 kg)  01/25/21 255 lb 6.4 oz (115.8 kg)    Physical Exam Vitals and nursing note reviewed.  Constitutional:      General: He is not in acute distress.    Appearance: Normal appearance. He is not ill-appearing or diaphoretic.  HENT:     Head: Normocephalic and atraumatic.     Right Ear: Tympanic membrane and external ear normal. There is no impacted cerumen.     Left Ear: External ear normal.     Nose: No congestion or rhinorrhea.     Mouth/Throat:     Pharynx: No oropharyngeal exudate or posterior oropharyngeal erythema.  Eyes:     Conjunctiva/sclera: Conjunctivae normal.     Pupils: Pupils are equal, round, and reactive to light.  Cardiovascular:     Rate and Rhythm: Normal rate and regular rhythm.     Heart sounds: No murmur heard.   No friction rub. No gallop.  Pulmonary:     Effort: No respiratory distress.     Breath sounds: No stridor. No wheezing or rhonchi.  Chest:     Chest wall: No tenderness.  Abdominal:     General: Abdomen is flat. Bowel sounds are normal.     Palpations: Abdomen is soft. There is no mass.     Tenderness: There is  no abdominal tenderness.  Musculoskeletal:     Cervical back: Normal range of motion and neck supple. No rigidity or tenderness.     Left lower leg: No edema.  Skin:    General: Skin is warm and dry.  Neurological:     Mental Status: He is alert.    Results for orders placed or performed in visit on 04/04/21  Lipid Panel w/o Chol/HDL Ratio  Result Value Ref Range   Cholesterol, Total 192 100 - 199 mg/dL   Triglycerides 188 (H) 0 - 149 mg/dL   HDL 36 (L) >41 mg/dL   VLDL Cholesterol Cal 35 5 - 40 mg/dL   LDL Chol Calc (NIH) 660 (H) 0 - 99 mg/dL  Hemoglobin Y3K  Result Value Ref Range   Hgb A1c MFr Bld 5.8 (H) 4.8 - 5.6 %   Est. average glucose Bld gHb Est-mCnc 120 mg/dL  TSH  Result Value Ref Range   TSH 2.830 0.450 - 4.500 uIU/mL        Current Outpatient Medications:  .  B Complex  Vitamins (VITAMIN B-COMPLEX) TABS, Take 1 tablet by mouth daily., Disp: , Rfl:  .  meloxicam (MOBIC) 7.5 MG tablet, , Disp: , Rfl:  .  Multiple Vitamin (MULTI-VITAMINS) TABS, Take by mouth., Disp: , Rfl:  .  simvastatin (ZOCOR) 40 MG tablet, Take 1 tablet (40 mg total) by mouth every evening., Disp: 90 tablet, Rfl: 4 .  verapamil (CALAN-SR) 120 MG CR tablet, Take 1 tablet (120 mg total) by mouth at bedtime., Disp: 30 tablet, Rfl: 2    Assessment & Plan:  Htn is on 115 / 65 mm hg  Is on verapmil 120 mg daily for such takes 60  mg daily  Grade 1 dystolic dysfunction Saw cards as well and said to contiue the above.  Continue current meds.  Medication compliance emphasised. pt advised to keep Bp logs. Pt verbalised understanding of the same. Pt to have a low salt diet . Exercise to reach a goal of at least 150 mins a week.  lifestyle modifications explained and pt understands importance of the above.  2 Systolic murmur, mild aortic valve calcification with no significant stenosis noted on echocardiogram.  3. HLD is stable. Is on zocor change to crestor 10 mg sec to TG not improving and recnet increase in LDL  recheck FLP, check LFT's work on diet, SE of meds explained to pt. low fat and high fiber diet explained to pt.   4. DISH diffuse idiopathic skeletal hyperostosis)  sees Neurosurgery   5. Lmbar radiculopathy - had an MRI of entire spine.  To have Physical therapy after results To fu with Neurosur for the above.  Problem List Items Addressed This Visit   None   No orders of the defined types were placed in this encounter.    No orders of the defined types were placed in this encounter.    Follow up plan: No follow-ups on file.

## 2021-06-16 ENCOUNTER — Encounter
Admission: RE | Disposition: A | Discharge status: Home / Self Care | Payer: Self-pay | Source: Ambulatory Visit | Attending: Gastroenterology

## 2021-06-16 ENCOUNTER — Other Ambulatory Visit: Payer: Self-pay

## 2021-06-16 ENCOUNTER — Encounter: Payer: Self-pay | Admitting: Gastroenterology

## 2021-06-16 ENCOUNTER — Ambulatory Visit
Admission: RE | Admit: 2021-06-16 | Discharge: 2021-06-16 | Disposition: A | Discharge status: Home / Self Care | Payer: No Typology Code available for payment source | Source: Ambulatory Visit | Attending: Gastroenterology | Admitting: Gastroenterology

## 2021-06-16 ENCOUNTER — Ambulatory Visit: Payer: No Typology Code available for payment source | Admitting: Anesthesiology

## 2021-06-16 DIAGNOSIS — E669 Obesity, unspecified: Secondary | ICD-10-CM | POA: Insufficient documentation

## 2021-06-16 DIAGNOSIS — Z86010 Personal history of colonic polyps: Secondary | ICD-10-CM | POA: Diagnosis not present

## 2021-06-16 DIAGNOSIS — K5730 Diverticulosis of large intestine without perforation or abscess without bleeding: Secondary | ICD-10-CM | POA: Diagnosis not present

## 2021-06-16 DIAGNOSIS — I10 Essential (primary) hypertension: Secondary | ICD-10-CM | POA: Insufficient documentation

## 2021-06-16 DIAGNOSIS — Z09 Encounter for follow-up examination after completed treatment for conditions other than malignant neoplasm: Secondary | ICD-10-CM | POA: Diagnosis present

## 2021-06-16 DIAGNOSIS — Z6833 Body mass index (BMI) 33.0-33.9, adult: Secondary | ICD-10-CM | POA: Diagnosis not present

## 2021-06-16 DIAGNOSIS — K635 Polyp of colon: Secondary | ICD-10-CM | POA: Insufficient documentation

## 2021-06-16 HISTORY — DX: Essential (primary) hypertension: I10

## 2021-06-16 HISTORY — PX: COLONOSCOPY WITH PROPOFOL: SHX5780

## 2021-06-16 SURGERY — COLONOSCOPY WITH PROPOFOL
Anesthesia: General

## 2021-06-16 MED ORDER — PROPOFOL 10 MG/ML IV BOLUS
INTRAVENOUS | Status: DC | PRN
  Administered 2021-06-16: 100 mg via INTRAVENOUS

## 2021-06-16 MED ORDER — PROPOFOL 10 MG/ML IV BOLUS
INTRAVENOUS | Status: AC
  Filled 2021-06-16: qty 20

## 2021-06-16 MED ORDER — LIDOCAINE HCL (PF) 2 % IJ SOLN
INTRAMUSCULAR | Status: AC
  Filled 2021-06-16: qty 5

## 2021-06-16 MED ORDER — PROPOFOL 500 MG/50ML IV EMUL
INTRAVENOUS | Status: AC
  Filled 2021-06-16: qty 50

## 2021-06-16 MED ORDER — PROPOFOL 500 MG/50ML IV EMUL
INTRAVENOUS | Status: DC | PRN
  Administered 2021-06-16: 120 ug/kg/min via INTRAVENOUS

## 2021-06-16 MED ORDER — LIDOCAINE HCL (CARDIAC) PF 100 MG/5ML IV SOSY
PREFILLED_SYRINGE | INTRAVENOUS | Status: DC | PRN
  Administered 2021-06-16: 50 mg via INTRAVENOUS

## 2021-06-16 MED ORDER — SODIUM CHLORIDE 0.9 % IV SOLN: INTRAVENOUS | Status: DC

## 2021-06-16 NOTE — Anesthesia Preprocedure Evaluation (Signed)
Anesthesia Evaluation  Patient identified by MRN, date of birth, ID band Patient awake    Reviewed: Allergy & Precautions, NPO status , Patient's Chart, lab work & pertinent test results  Airway Mallampati: III  TM Distance: >3 FB Neck ROM: full    Dental no notable dental hx. (+) Chipped   Pulmonary neg pulmonary ROS,    Pulmonary exam normal        Cardiovascular hypertension, negative cardio ROS Normal cardiovascular exam     Neuro/Psych negative neurological ROS  negative psych ROS   GI/Hepatic negative GI ROS, Neg liver ROS,   Endo/Other  negative endocrine ROS  Renal/GU negative Renal ROS  negative genitourinary   Musculoskeletal   Abdominal (+) + obese,   Peds  Hematology negative hematology ROS (+)   Anesthesia Other Findings Past Medical History: No date: Allergy No date: Arthritis No date: Glaucoma No date: Hyperlipidemia No date: Hypertension  Past Surgical History: No date: TONSILLECTOMY  BMI    Body Mass Index: 33.47 kg/m      Reproductive/Obstetrics negative OB ROS                             Anesthesia Physical Anesthesia Plan  ASA: 2  Anesthesia Plan: General   Post-op Pain Management:    Induction: Intravenous  PONV Risk Score and Plan: Propofol infusion and TIVA  Airway Management Planned: Natural Airway and Nasal Cannula  Additional Equipment:   Intra-op Plan:   Post-operative Plan:   Informed Consent: I have reviewed the patients History and Physical, chart, labs and discussed the procedure including the risks, benefits and alternatives for the proposed anesthesia with the patient or authorized representative who has indicated his/her understanding and acceptance.     Dental Advisory Given  Plan Discussed with: Anesthesiologist, CRNA and Surgeon  Anesthesia Plan Comments: (Patient consented for risks of anesthesia including but not  limited to:  - adverse reactions to medications - risk of airway placement if required - damage to eyes, teeth, lips or other oral mucosa - nerve damage due to positioning  - sore throat or hoarseness - Damage to heart, brain, nerves, lungs, other parts of body or loss of life  Patient voiced understanding.)        Anesthesia Quick Evaluation

## 2021-06-16 NOTE — Op Note (Signed)
[facility] Patient Name: Jason Frye Procedure Date: 06/16/2021 7:36 AM MRN: [id number] Account #: [id number] Date of Birth: 1955/12/11 Admit Type: Outpatient Age: 65 Room: [facility] ENDO ROOM 2 Gender: Male Note Status: Finalized Instrument Name: Prentice Docker [id number] Procedure:             Colonoscopy Indications:           High risk colon cancer surveillance: Personal history                         of colonic polyps, Pt reports having a colonoscopy in                         [place], [place] 3 yrs ago and states 10 polyps were removed                         and he was advised to have a repeat in 3 yrs.                         Procedure or pathology report not available. Providers:             Dolphus Jenny. Maximino Greenland MD, MD Referring MD:          Loura Pardon (Referring MD) Medicines:             Monitored Anesthesia Care Complications:         No immediate complications. Procedure:             Pre-Anesthesia Assessment:                        - ASA Grade Assessment: II - A patient with mild                         systemic disease.                        - Prior to the procedure, a History and Physical was                         performed, and patient medications, allergies and                         sensitivities were reviewed. The patient's tolerance                         of previous anesthesia was reviewed.                        - The risks and benefits of the procedure and the                         sedation options and risks were discussed with the                         patient. All questions were answered and informed                         consent was obtained.                        -  Patient identification and proposed procedure were                         verified prior to the procedure by the physician, the                         nurse, the anesthesiologist, the anesthetist and the                         technician. The procedure was  verified in the                         procedure room.                        After obtaining informed consent, the colonoscope was                         passed under direct vision. Throughout the procedure,                         the patient's blood pressure, pulse, and oxygen                         saturations were monitored continuously. The                         Colonoscope was introduced through the anus and                         advanced to the the cecum, identified by appendiceal                         orifice and ileocecal valve. The colonoscopy was                         performed with ease. The patient tolerated the                         procedure well. The quality of the bowel preparation                         was good. Findings:      The perianal and digital rectal examinations were normal.      A 4 mm polyp was found in the sigmoid colon. The polyp was flat. The       polyp was removed with a jumbo cold forceps. Resection and retrieval       were complete.      Multiple diverticula were found in the sigmoid colon.      The exam was otherwise without abnormality.      The rectum, sigmoid colon, descending colon, transverse colon, ascending       colon and cecum appeared normal.      The retroflexed view of the distal rectum and anal verge was normal and       showed no anal or rectal abnormalities. Impression:            - One 4 mm polyp in the sigmoid colon, removed with a  jumbo cold forceps. Resected and retrieved.                        - Diverticulosis in the sigmoid colon.                        - The examination was otherwise normal.                        - The rectum, sigmoid colon, descending colon,                         transverse colon, ascending colon and cecum are normal.                        - The distal rectum and anal verge are normal on                         retroflexion view. Recommendation:        - Discharge  patient to home (with escort).                        - Obtain previous colonoscopy report to be able to                         determine interval for future surveillance.                        - Advance diet as tolerated.                        - Continue present medications.                        - Await pathology results.                        - Repeat colonoscopy date to be determined after                         pending pathology results are reviewed.                        - The findings and recommendations were discussed with                         the patient.                        - The findings and recommendations were discussed with                         the patient's family.                        - Return to primary care physician as previously                         scheduled.                        - High  fiber diet. Procedure Code(s):     --- Professional ---                        [phone], Colonoscopy, flexible; with biopsy, single or                         multiple Diagnosis Code(s):     --- Professional ---                        Z86.010, Personal history of colonic polyps                        K63.5, Polyp of colon CPT copyright 2019 American Medical Association. All rights reserved. The codes documented in this report are preliminary and upon coder review may  be revised to meet current compliance requirements.  Melodie Bouillon, MD Michel Bickers B. Maximino Greenland MD, MD 06/16/2021 8:24:47 AM This report has been signed electronically. Number of Addenda: 0 Note Initiated On: 06/16/2021 7:36 AM Scope Withdrawal Time: 0 hours 19 minutes 7 seconds  Total Procedure Duration: 0 hours 25 minutes 21 seconds  Estimated Blood Loss:  Estimated blood loss: none.      [facility]

## 2021-06-16 NOTE — H&P (Signed)
Melodie Bouillon, MD [address], [address], [place], [place], [address] [address], [place], [place], [address] Phone: [phone]  Fax: [phone]  Primary Care Physician:  Loura Pardon, MD   Pre-Procedure History & Physical: HPI:  Jason Frye is a 65 y.o. male is here for a colonoscopy.   Past Medical History:  Diagnosis Date   Allergy    Arthritis    Glaucoma    Hyperlipidemia    Hypertension     Past Surgical History:  Procedure Laterality Date   TONSILLECTOMY      Prior to Admission medications   Medication Sig Start Date End Date Taking? Authorizing Provider  B Complex Vitamins (VITAMIN B-COMPLEX) TABS Take 1 tablet by mouth daily.   Yes [provider]  Multiple Vitamin (MULTI-VITAMINS) TABS Take by mouth.   Yes [provider]  meloxicam (MOBIC) 7.5 MG tablet  08/15/18   [provider]  rosuvastatin (CRESTOR) 10 MG tablet Take 1 tablet (10 mg total) by mouth daily. 05/11/21   Vigg, Avanti, MD  verapamil (CALAN-SR) 120 MG CR tablet Take 1 tablet (120 mg total) by mouth at bedtime. 02/06/21   Loura Pardon, MD    Allergies as of 02/23/2021 - Review Complete 02/20/2021  Allergen Reaction Noted   Penicillins  04/22/2014    Family History  Problem Relation Age of Onset   Cancer Mother    Heart Problems Mother    Cancer Father    Heart disease Father    Heart Problems Sister    Cancer Sister    Breast cancer Sister    Heart Problems Sister    Cancer Sister    Heart disease Sister    Thyroid cancer Sister     Social History   Socioeconomic History   Marital status: Divorced    Spouse name: Not on file   Number of children: Not on file   Years of education: Not on file   Highest education level: Not on file  Occupational History   Not on file  Tobacco Use   Smoking status: Never   Smokeless tobacco: Never  Vaping Use   Vaping Use: Never used  Substance and Sexual Activity   Alcohol use: Yes     Comment: on occasion   Drug use: Never   Sexual activity: Not Currently  Other Topics Concern   Not on file  Social History Narrative   Not on file   Social Determinants of Health   Financial Resource Strain: Not on file  Food Insecurity: Not on file  Transportation Needs: Not on file  Physical Activity: Not on file  Stress: Not on file  Social Connections: Not on file  Intimate Partner Violence: Not on file    Review of Systems: See HPI, otherwise negative ROS  Physical Exam: Constitutional: General:   Alert,  Well-developed, well-nourished, pleasant and cooperative in NAD BP (!) 150/78   Pulse 96   Temp (!) 96.3 F (35.7 C) (Temporal)   Resp 20   Ht 5\' 11"  (1.803 m)   Wt 108.9 kg   SpO2 97%   BMI 33.47 kg/m   Head: Normocephalic, atraumatic.   Eyes:  Sclera clear, no icterus.   Conjunctiva pink.   Mouth:  No deformity or lesions, oropharynx pink & moist.  Neck:  Supple, trachea midline  Respiratory: Normal respiratory effort  Gastrointestinal:  Soft, non-tender and non-distended without masses, hepatosplenomegaly or hernias noted.  No guarding or rebound tenderness.  Cardiac: No clubbing or edema.  No cyanosis. Normal posterior tibial pedal pulses noted.  Lymphatic:  No significant cervical adenopathy.  Psych:  Alert and cooperative. Normal mood and affect.  Musculoskeletal:   Symmetrical without gross deformities. 5/5 Lower extremity strength bilaterally.  Skin: Warm. Intact without significant lesions or rashes. No jaundice.  Neurologic:  Face symmetrical, tongue midline, Normal sensation to touch;  grossly normal neurologically.  Psych:  Alert and oriented x3, Alert and cooperative. Normal mood and affect.  Impression/Plan: Jason Frye is here for a colonoscopy to be performed for history of polyps in 2019. Report not available  Risks, benefits, limitations, and alternatives regarding  colonoscopy have been reviewed with the patient.   Questions have been answered.  All parties agreeable.   Pasty Spillers, MD  06/16/2021, 7:36 AM

## 2021-06-16 NOTE — Anesthesia Postprocedure Evaluation (Signed)
Anesthesia Post Note  Patient: BREVYN RING  Procedure(s) Performed: COLONOSCOPY WITH PROPOFOL  Patient location during evaluation: Endoscopy Anesthesia Type: General Level of consciousness: awake and alert Pain management: pain level controlled Vital Signs Assessment: post-procedure vital signs reviewed and stable Respiratory status: spontaneous breathing, nonlabored ventilation, respiratory function stable and patient connected to nasal cannula oxygen Cardiovascular status: blood pressure returned to baseline and stable Postop Assessment: no apparent nausea or vomiting Anesthetic complications: no   No notable events documented.   Last Vitals:  Vitals:   06/16/21 0830 06/16/21 0840  BP: (!) 98/58 127/69  Pulse: 68 66  Resp: 12 16  Temp:    SpO2: 97% 97%    Last Pain:  Vitals:   06/16/21 0810  TempSrc: Temporal  PainSc:                  Johny Blamer

## 2021-06-16 NOTE — Transfer of Care (Signed)
Immediate Anesthesia Transfer of Care Note  Patient: Jason Frye  Procedure(s) Performed: COLONOSCOPY WITH PROPOFOL  Patient Location: PACU and Endoscopy Unit  Anesthesia Type:General  Level of Consciousness: drowsy and patient cooperative  Airway & Oxygen Therapy: Patient Spontanous Breathing  Post-op Assessment: Report given to RN and Post -op Vital signs reviewed and stable  Post vital signs: Reviewed and stable  Last Vitals:  Vitals Value Taken Time  BP    Temp    Pulse 73 06/16/21 0817  Resp 19 06/16/21 0817  SpO2 95 % 06/16/21 0817  Vitals shown include unvalidated device data.  Last Pain:  Vitals:   06/16/21 0810  TempSrc: Temporal  PainSc:          Complications: No notable events documented.

## 2021-06-19 ENCOUNTER — Encounter: Payer: Self-pay | Admitting: Gastroenterology

## 2021-06-19 LAB — SURGICAL PATHOLOGY

## 2021-08-07 ENCOUNTER — Other Ambulatory Visit: Payer: Self-pay | Admitting: Internal Medicine

## 2021-08-07 MED ORDER — MELOXICAM 7.5 MG PO TABS: 7.5000 mg | ORAL_TABLET | Freq: Every day | ORAL | 0 refills | Status: DC

## 2021-09-19 ENCOUNTER — Other Ambulatory Visit: Payer: Self-pay | Admitting: Internal Medicine

## 2021-09-19 NOTE — Telephone Encounter (Signed)
Requested Prescriptions  Pending Prescriptions Disp Refills   meloxicam (MOBIC) 7.5 MG tablet [Pharmacy Med Name: MELOXICAM 7.5 MG TABLET] 14 tablet 0    Sig: TAKE 1 TABLET BY MOUTH EVERY DAY     Analgesics:  COX2 Inhibitors Failed - 09/19/2021 11:02 AM      Failed - Manual Review: Labs are only required if the patient has taken medication for more than 8 weeks.      Passed - HGB in normal range and within 360 days    Hemoglobin  Date Value Ref Range Status  01/04/2021 14.6 13.0 - 17.7 g/dL Final         Passed - Cr in normal range and within 360 days    Creatinine, Ser  Date Value Ref Range Status  01/04/2021 1.16 0.76 - 1.27 mg/dL Final         Passed - HCT in normal range and within 360 days    Hematocrit  Date Value Ref Range Status  01/04/2021 43.3 37.5 - 51.0 % Final         Passed - AST in normal range and within 360 days    AST  Date Value Ref Range Status  01/04/2021 26 0 - 40 IU/L Final         Passed - ALT in normal range and within 360 days    ALT  Date Value Ref Range Status  01/04/2021 32 0 - 44 IU/L Final         Passed - eGFR is 30 or above and within 360 days    eGFR  Date Value Ref Range Status  01/04/2021 71 >59 mL/min/1.73 Final         Passed - Patient is not pregnant      Passed - Valid encounter within last 12 months    Recent Outpatient Visits          4 months ago Primary hypertension   [facility] Vigg, Avanti, MD   7 months ago [place] Vigg, Customer service manager, MD   7 months ago Diastolic dysfunction   [facility] Vigg, Avanti, MD   7 months ago Screen for colon cancer   [place] Vigg, Avanti, MD   8 months ago DISH (diffuse idiopathic skeletal hyperostosis)   [facility] Vigg, Avanti, MD              rosuvastatin (CRESTOR) 10 MG tablet [Pharmacy Med Name: ROSUVASTATIN CALCIUM 10 MG TAB] 30 tablet 3    Sig: TAKE 1 TABLET BY MOUTH EVERY DAY      Cardiovascular:  Antilipid - Statins 2 Failed - 09/19/2021 11:02 AM      Failed - Lipid Panel in normal range within the last 12 months    Cholesterol, Total  Date Value Ref Range Status  04/04/2021 192 100 - 199 mg/dL Final   LDL Chol Calc (NIH)  Date Value Ref Range Status  04/04/2021 121 (H) 0 - 99 mg/dL Final   HDL  Date Value Ref Range Status  04/04/2021 36 (L) >39 mg/dL Final   Triglycerides  Date Value Ref Range Status  04/04/2021 200 (H) 0 - 149 mg/dL Final         Passed - Cr in normal range and within 360 days    Creatinine, Ser  Date Value Ref Range Status  01/04/2021 1.16 0.76 - 1.27 mg/dL Final         Passed - Patient is  not pregnant      Passed - Valid encounter within last 12 months    Recent Outpatient Visits          4 months ago Primary hypertension   [place] Vigg, Avanti, MD   7 months ago COVID-4   [place], [place]   7 months ago Diastolic dysfunction   [facility] Vigg, Avanti, MD   7 months ago Screen for colon cancer   [place] Vigg, Avanti, MD   8 months ago DISH (diffuse idiopathic skeletal hyperostosis)   [facility] Vigg, Avanti, MD

## 2021-09-19 NOTE — Telephone Encounter (Signed)
Requested medication (s) are due for refill today: yes  Requested medication (s) are on the active medication list: yes  Last refill:  08/07/21 #14/0  Future visit scheduled: no  Notes to clinic:  pt was only given short supply please advise for refill     Requested Prescriptions  Pending Prescriptions Disp Refills   meloxicam (MOBIC) 7.5 MG tablet [Pharmacy Med Name: MELOXICAM 7.5 MG TABLET] 14 tablet 0    Sig: TAKE 1 TABLET BY MOUTH EVERY DAY     Analgesics:  COX2 Inhibitors Failed - 09/19/2021 11:02 AM      Failed - Manual Review: Labs are only required if the patient has taken medication for more than 8 weeks.      Passed - HGB in normal range and within 360 days    Hemoglobin  Date Value Ref Range Status  01/04/2021 14.6 13.0 - 17.7 g/dL Final          Passed - Cr in normal range and within 360 days    Creatinine, Ser  Date Value Ref Range Status  01/04/2021 1.16 0.76 - 1.27 mg/dL Final          Passed - HCT in normal range and within 360 days    Hematocrit  Date Value Ref Range Status  01/04/2021 43.3 37.5 - 51.0 % Final          Passed - AST in normal range and within 360 days    AST  Date Value Ref Range Status  01/04/2021 26 0 - 40 IU/L Final          Passed - ALT in normal range and within 360 days    ALT  Date Value Ref Range Status  01/04/2021 32 0 - 44 IU/L Final          Passed - eGFR is 30 or above and within 360 days    eGFR  Date Value Ref Range Status  01/04/2021 71 >59 mL/min/1.73 Final          Passed - Patient is not pregnant      Passed - Valid encounter within last 12 months    Recent Outpatient Visits           4 months ago Primary hypertension   [facility] Vigg, Avanti, MD   7 months ago [place] Vigg, Customer service manager, MD   7 months ago Diastolic dysfunction   [facility] Vigg, Avanti, MD   7 months ago Screen for colon cancer   [facility] Vigg, Avanti, MD   8  months ago DISH (diffuse idiopathic skeletal hyperostosis)   [facility] Vigg, Avanti, MD              Signed Prescriptions Disp Refills   rosuvastatin (CRESTOR) 10 MG tablet 30 tablet 2    Sig: TAKE 1 TABLET BY MOUTH EVERY DAY     Cardiovascular:  Antilipid - Statins 2 Failed - 09/19/2021 11:02 AM      Failed - Lipid Panel in normal range within the last 12 months    Cholesterol, Total  Date Value Ref Range Status  04/04/2021 192 100 - 199 mg/dL Final   LDL Chol Calc (NIH)  Date Value Ref Range Status  04/04/2021 121 (H) 0 - 99 mg/dL Final   HDL  Date Value Ref Range Status  04/04/2021 36 (L) >39 mg/dL Final   Triglycerides  Date Value Ref Range Status  04/04/2021 200 (  H) 0 - 149 mg/dL Final         Passed - Cr in normal range and within 360 days    Creatinine, Ser  Date Value Ref Range Status  01/04/2021 1.16 0.76 - 1.27 mg/dL Final          Passed - Patient is not pregnant      Passed - Valid encounter within last 12 months    Recent Outpatient Visits           4 months ago Primary hypertension   [place] Vigg, Avanti, MD   7 months ago [place], MD   7 months ago Diastolic dysfunction   [facility] Vigg, Avanti, MD   7 months ago Screen for colon cancer   [place] Vigg, Avanti, MD   8 months ago DISH (diffuse idiopathic skeletal hyperostosis)   [facility] Vigg, Avanti, MD

## 2021-09-21 ENCOUNTER — Other Ambulatory Visit: Payer: Self-pay

## 2021-09-21 ENCOUNTER — Encounter: Payer: Self-pay | Admitting: Internal Medicine

## 2021-09-21 MED ORDER — VERAPAMIL HCL ER 120 MG PO TBCR: 120.0000 mg | EXTENDED_RELEASE_TABLET | Freq: Every day | ORAL | 1 refills | Status: DC

## 2021-09-21 MED ORDER — MELOXICAM 7.5 MG PO TABS: 7.5000 mg | ORAL_TABLET | Freq: Every day | ORAL | 0 refills | Status: DC

## 2021-09-21 MED ORDER — ROSUVASTATIN CALCIUM 10 MG PO TABS: 10.0000 mg | ORAL_TABLET | Freq: Every day | ORAL | 1 refills | Status: DC

## 2021-09-21 NOTE — Telephone Encounter (Signed)
Ok to refill as 90 days thnx not meloxicam its not an everyday med the rx I sent in was the way it needs to be taken let pt know pl thnx

## 2021-10-04 ENCOUNTER — Encounter: Payer: Self-pay | Admitting: Internal Medicine

## 2021-11-21 ENCOUNTER — Ambulatory Visit (INDEPENDENT_AMBULATORY_CARE_PROVIDER_SITE_OTHER): Payer: No Typology Code available for payment source | Admitting: Internal Medicine

## 2021-11-21 ENCOUNTER — Encounter: Payer: Self-pay | Admitting: Internal Medicine

## 2021-11-21 VITALS — BP 130/72 | HR 80 | Temp 98.2°F | Ht 70.98 in | Wt 254.6 lb

## 2021-11-21 DIAGNOSIS — H1033 Unspecified acute conjunctivitis, bilateral: Secondary | ICD-10-CM | POA: Insufficient documentation

## 2021-11-21 DIAGNOSIS — J069 Acute upper respiratory infection, unspecified: Secondary | ICD-10-CM | POA: Insufficient documentation

## 2021-11-21 MED ORDER — ERYTHROMYCIN 5 MG/GM OP OINT: 1.0000 "application " | TOPICAL_OINTMENT | Freq: Three times a day (TID) | OPHTHALMIC | 0 refills | Status: AC

## 2021-11-21 NOTE — Progress Notes (Signed)
? ?BP 130/72   Pulse 80   Temp 98.2 ?F (36.8 ?C) (Oral)   Ht 5' 10.98" (1.803 m)   Wt 254 lb 9.6 oz (115.5 kg)   SpO2 97%   BMI 35.53 kg/m?   ? ?Subjective:  ? ? Patient ID: Jason Frye, male    DOB: 1956-05-04, 66 y.o.   MRN: [id number] ? ?Chief Complaint  ?Patient presents with  ?? Conjunctivitis  ?  Started on Sunday, states b/l eye itching.   ?? URI  ?  Tested negative for Covid this morning.  ? ? ?HPI: ?Jason Frye is a 66 y.o. male ? ?Took a home COVID test and is -ve, since Sunday night has had pink eye  ? ?Conjunctivitis  ?The current episode started 2 days ago. The problem has been gradually worsening. The problem is mild. Associated symptoms include eye itching, URI, eye discharge and eye redness. Pertinent negatives include no orthopnea, no fever, no decreased vision, no double vision, no photophobia, no abdominal pain, no constipation, no diarrhea, no nausea, no vomiting, no congestion, no ear pain, no headaches, no hearing loss, no mouth sores, no rhinorrhea, no sore throat, no stridor, no swollen glands, no cough and no wheezing.  ? ?Chief Complaint  ?Patient presents with  ?? Conjunctivitis  ?  Started on Sunday, states b/l eye itching.   ?? URI  ?  Tested negative for Covid this morning.  ? ? ?Relevant past medical, surgical, family and social history reviewed and updated as indicated. Interim medical history since our last visit reviewed. ?Allergies and medications reviewed and updated. ? ?Review of Systems  ?Constitutional:  Negative for fever.  ?HENT:  Negative for congestion, ear pain, hearing loss, mouth sores, rhinorrhea and sore throat.   ?Eyes:  Positive for discharge, redness and itching. Negative for double vision and photophobia.  ?Respiratory:  Negative for cough, wheezing and stridor.   ?Cardiovascular:  Negative for orthopnea.  ?Gastrointestinal:  Negative for abdominal pain, constipation, diarrhea, nausea and vomiting.  ?Neurological:  Negative for headaches.  ? ?Per HPI  unless specifically indicated above ? ?   ?Objective:  ?  ?BP 130/72   Pulse 80   Temp 98.2 ?F (36.8 ?C) (Oral)   Ht 5' 10.98" (1.803 m)   Wt 254 lb 9.6 oz (115.5 kg)   SpO2 97%   BMI 35.53 kg/m?   ?Wt Readings from Last 3 Encounters:  ?11/21/21 254 lb 9.6 oz (115.5 kg)  ?06/16/21 240 lb (108.9 kg)  ?05/11/21 247 lb 11.2 oz (112.4 kg)  ?  ?Physical Exam ?Eyes:  ?   General:     ?   Right eye: No foreign body, discharge or hordeolum.     ?   Left eye: No foreign body or discharge.  ?   Conjunctiva/sclera:  ?   Right eye: Right conjunctiva is injected.  ?   Left eye: Left conjunctiva is injected.  ? ?Results for orders placed or performed during the hospital encounter of 06/16/21  ?Surgical pathology  ?Result Value Ref Range  ? SURGICAL PATHOLOGY    ?  SURGICAL PATHOLOGY ?CASE: [id number] ?PATIENT: Jason Frye ?Surgical Pathology Report ? ? ? ? ?Specimen Submitted: ?A. Colon polyp, sigmoid; cbx ? ?Clinical History: History of colon polyps. Findings: Colon polyp, ?diverticulosis ? ? ? ? ?DIAGNOSIS: ?A.  COLON POLYP, SIGMOID; COLD BIOPSY: ?- COLONIC MUCOSA WITH PROMINENT LYMPHOID FOLLICLES. ?- NEGATIVE FOR DYSPLASIA AND MALIGNANCY (ADDITIONAL DEEPER SECTIONS WERE ?REVIEWED). ? ?GROSS DESCRIPTION: ?A. Labeled:  Sigmoid colon polyp cbx ?Received: Formalin ?Collection time: 8:03 AM on 06/16/2021 ?Placed into formalin time: 8:03 AM on 06/16/2021 ?Tissue fragment(s): 1 ?Size: 0.4 x 0.2 x 0.1 cm ?Description: Received is a tan soft tissue fragment, admixed with ?intestinal debris.  The ratio of soft tissue to intestinal debris is 90: ?10. ?Entirely submitted in 1 cassette. ? ?[facility] 06/16/2021 ? ?Final Diagnosis performed by Ronald Lobo, MD.   Electronically signed ?06/19/2021 1:43:54PM ?The electronic signature indicates that the named Attend ing Pathologist ?has evaluated the specimen ?Technical component performed at [facility], [address] [place], ?[place] [address] Lab: [phone] Dir: Jolene Schimke, MD, MMM ?  Professional component performed at [facility], [facility], [address] [place], [place], [place] [address] Lab: [phone] ?Dir: Beryle Quant, MD ?  ? ?   ? ? ?Current Outpatient Medications:  ??  B Complex Vitamins (VITAMIN B-COMPLEX) TABS, Take 1 tablet by mouth daily., Disp: , Rfl:  ??  meloxicam (MOBIC) 7.5 MG tablet, Take 1 tablet (7.5 mg total) by mouth daily., Disp: 14 tablet, Rfl: 0 ??  Multiple Vitamin (MULTI-VITAMINS) TABS, Take by mouth., Disp: , Rfl:  ??  rosuvastatin (CRESTOR) 10 MG tablet, Take 1 tablet (10 mg total) by mouth daily., Disp: 90 tablet, Rfl: 1 ??  methocarbamol (ROBAXIN) 750 MG tablet, methocarbamol 750 mg tablet  Take 1 tablet 3 times a day by oral route., Disp: , Rfl:  ??  verapamil (CALAN-SR) 120 MG CR tablet, Take 1 tablet (120 mg total) by mouth at bedtime., Disp: 90 tablet, Rfl: 1  ? ? ?Assessment & Plan:  ? ?Acute conjunctivitis ?Will start pt on erythromycin eye ointment for symptomatic relief ? ? ?Problem List Items Addressed This Visit   ?None ?Visit Diagnoses   ? ? Upper respiratory tract infection, unspecified type    -  Primary  ? ?  ?  ? ?No orders of the defined types were placed in this encounter. ?  ? ?No orders of the defined types were placed in this encounter. ?  ? ?Follow up plan: ?No follow-ups on file. ? ? ?

## 2022-03-26 ENCOUNTER — Other Ambulatory Visit: Payer: Self-pay

## 2022-03-26 NOTE — Telephone Encounter (Signed)
LOV 11/21/21  Future appt none noted.

## 2022-09-25 NOTE — Progress Notes (Unsigned)
There were no vitals taken for this visit.   Subjective:    Patient ID: Jason Frye, male    DOB: [birth year], 67 y.o.   MRN: [id number]  HPI: Jason Frye is a 67 y.o. male  No chief complaint on file.  HYPERTENSION / HYPERLIPIDEMIA Satisfied with current treatment? {Blank single:19197::"yes","no"} Duration of hypertension: {Blank single:19197::"chronic","months","years"} BP monitoring frequency: {Blank single:19197::"not checking","rarely","daily","weekly","monthly","a few times a day","a few times a week","a few times a month"} BP range:  BP medication side effects: {Blank single:19197::"yes","no"} Past BP meds: {Blank A999333 (bystolic)","carvedilol","chlorthalidone","clonidine","diltiazem","exforge HCT","HCTZ","irbesartan (avapro)","labetalol","lisinopril","lisinopril-HCTZ","losartan (cozaar)","methyldopa","nifedipine","olmesartan (benicar)","olmesartan-HCTZ","quinapril","ramipril","spironalactone","tekturna","valsartan","valsartan-HCTZ","verapamil"} Duration of hyperlipidemia: {Blank single:19197::"chronic","months","years"} Cholesterol medication side effects: {Blank single:19197::"yes","no"} Cholesterol supplements: {Blank multiple:19196::"none","fish oil","niacin","red yeast rice"} Past cholesterol medications: {Blank multiple:19196::"none","atorvastain (lipitor)","lovastatin (mevacor)","pravastatin (pravachol)","rosuvastatin (crestor)","simvastatin (zocor)","vytorin","fenofibrate (tricor)","gemfibrozil","ezetimide (zetia)","niaspan","lovaza"} Medication compliance: {Blank single:19197::"excellent compliance","good compliance","fair compliance","poor compliance"} Aspirin: {Blank single:19197::"yes","no"} Recent stressors: {Blank single:19197::"yes","no"} Recurrent headaches: {Blank single:19197::"yes","no"} Visual changes: {Blank single:19197::"yes","no"} Palpitations:  {Blank single:19197::"yes","no"} Dyspnea: {Blank single:19197::"yes","no"} Chest pain: {Blank single:19197::"yes","no"} Lower extremity edema: {Blank single:19197::"yes","no"} Dizzy/lightheaded: {Blank single:19197::"yes","no"}   HEADACHES Duration: {Blank single:19197::"chronic","days","weeks","months","years"} Onset: {Blank single:19197::"sudden","gradual"} Severity: {Blank single:19197::"mild","moderate","severe","1/10","2/10","3/10","4/10","5/10","6/10","7/10","8/10","9/10","10/10"} Quality: {Blank multiple:19196::"sharp","dull","aching","burning","cramping","ill-defined","itchy","pressure-like","pulling","shooting","sore","stabbing","tender","tearing","throbbing"} Frequency: {Blank single:19197::"constant","intermittent","occasional","rare","every few minutes","a few times a hour","a few times a day","a few times a week","a few times a month","a few times a year"} Location:  Headache duration: Radiation: {Blank single:19197::"yes","no"} Time of day headache occurs:  Alleviating factors:  Aggravating factors:  Headache status at time of visit: {Blank single:19197::"current headache","asymptomatic"} Treatments attempted: Treatments attempted: {Blank multiple:19196::"none","rest","ice","heat","APAP","ibuprofen","aleve", excedrine","triptans","propranolol","topamax","amitriptyline"}   Aura: {Blank single:19197::"yes","no"} Nausea:  {Blank single:19197::"yes","no"} Vomiting: {Blank single:19197::"yes","no"} Photophobia:  {Blank single:19197::"yes","no"} Phonophobia:  {Blank single:19197::"yes","no"} Effect on social functioning:  {Blank single:19197::"yes","no"} Numbers of missed days of school/work each month:  Confusion:  {Blank single:19197::"yes","no"} Gait disturbance/ataxia:  {Blank single:19197::"yes","no"} Behavioral changes:  {Blank single:19197::"yes","no"} Fevers:  {Blank single:19197::"yes","no"}  Relevant past medical, surgical, family and social history reviewed and  updated as indicated. Interim medical history since our last visit reviewed. Allergies and medications reviewed and updated.  Review of Systems  Per HPI unless specifically indicated above     Objective:    There were no vitals taken for this visit.  Wt Readings from Last 3 Encounters:  11/21/21 254 lb 9.6 oz (115.5 kg)  06/16/21 240 lb (108.9 kg)  05/11/21 247 lb 11.2 oz (112.4 kg)    Physical Exam  Results for orders placed or performed during the hospital encounter of 06/16/21  Surgical pathology  Result Value Ref Range   SURGICAL PATHOLOGY      SURGICAL PATHOLOGY CASE: [id number] PATIENT: Jason Frye Surgical Pathology Report     Specimen Submitted: A. Colon polyp, sigmoid; cbx  Clinical History: History of colon polyps. Findings: Colon polyp, diverticulosis     DIAGNOSIS: A.  COLON POLYP, SIGMOID; COLD BIOPSY: - COLONIC MUCOSA WITH PROMINENT LYMPHOID FOLLICLES. - NEGATIVE FOR DYSPLASIA AND MALIGNANCY (ADDITIONAL DEEPER SECTIONS WERE REVIEWED).  GROSS DESCRIPTION: A. Labeled: Sigmoid colon polyp cbx Received: Formalin Collection time: 8:03 AM on 06/16/2021 Placed into formalin time: 8:03 AM on 06/16/2021 Tissue fragment(s): 1 Size: 0.4 x 0.2 x 0.1 cm Description: Received is a tan soft tissue fragment, admixed with intestinal debris.  The ratio of soft tissue to intestinal debris is 90: 10. Entirely submitted in 1 cassette.  [facility] 06/16/2021  Final Diagnosis performed by Bryan Lemma, MD.   Electronically signed 06/19/2021 1:43:54PM The electronic signature indicates that the named Attend ing Pathologist has evaluated the specimen Technical component performed at [facility], [address] [place], [place] [address] Lab: [phone] Dir: Rush Farmer, MD, MMM  Professional component performed  at [facility], [facility], [place], [place], [place] [address] Lab: [phone] Dir: Kathi Simpers, MD       Assessment & Plan:    Problem List Items Addressed This Visit       Cardiovascular and Mediastinum   Primary hypertension - Primary     Other   Hyperlipidemia     Follow up plan: No follow-ups on file.

## 2022-09-26 ENCOUNTER — Encounter: Payer: Self-pay | Admitting: Nurse Practitioner

## 2022-09-26 ENCOUNTER — Ambulatory Visit (INDEPENDENT_AMBULATORY_CARE_PROVIDER_SITE_OTHER): Payer: No Typology Code available for payment source | Admitting: Nurse Practitioner

## 2022-09-26 VITALS — BP 127/66 | HR 76 | Temp 98.0°F | Wt 228.5 lb

## 2022-09-26 DIAGNOSIS — I10 Essential (primary) hypertension: Secondary | ICD-10-CM

## 2022-09-26 DIAGNOSIS — R519 Headache, unspecified: Secondary | ICD-10-CM | POA: Diagnosis not present

## 2022-09-26 DIAGNOSIS — E785 Hyperlipidemia, unspecified: Secondary | ICD-10-CM

## 2022-09-26 DIAGNOSIS — Z114 Encounter for screening for human immunodeficiency virus [HIV]: Secondary | ICD-10-CM | POA: Diagnosis not present

## 2022-09-26 DIAGNOSIS — Z1159 Encounter for screening for other viral diseases: Secondary | ICD-10-CM

## 2022-09-26 NOTE — Assessment & Plan Note (Signed)
Chronic.  Has lost a good amount of weight.  States he would like to switch back to Simvastatin due to cost.  Discussed ASCVD risk score with patient during visit and the recommendation for high intensity statin.  Will recalculate ASCVD risk score after lab work returns and patient will make a decision based on cardiac risk score.  Labs ordered today.  Follow up in 6 months.  Call sooner if concerns arise.

## 2022-09-26 NOTE — Assessment & Plan Note (Signed)
Chronic.  Controlled without medication..  Labs ordered today.  Return to clinic in 6 months for reevaluation.  Call sooner if concerns arise.  ° °

## 2022-09-27 LAB — LIPID PANEL
Chol/HDL Ratio: 4.5 ratio (ref 0.0–5.0)
Cholesterol, Total: 178 mg/dL (ref 100–199)
HDL: 40 mg/dL (ref >39)
LDL Chol Calc (NIH): 101 mg/dL — ABNORMAL HIGH (ref 0–99)
Triglycerides: 217 mg/dL — ABNORMAL HIGH (ref 0–149)
VLDL Cholesterol Cal: 37 mg/dL (ref 5–40)

## 2022-09-27 LAB — COMPREHENSIVE METABOLIC PANEL
ALT: 16 IU/L (ref 0–44)
AST: 23 IU/L (ref 0–40)
Albumin/Globulin Ratio: 1.8 (ref 1.2–2.2)
Albumin: 4.6 g/dL (ref 3.9–4.9)
Alkaline Phosphatase: 88 IU/L (ref 44–121)
BUN/Creatinine Ratio: 12 (ref 10–24)
BUN: 14 mg/dL (ref 8–27)
Bilirubin Total: 0.3 mg/dL (ref 0.0–1.2)
CO2: 25 mmol/L (ref 20–29)
Calcium: 9.9 mg/dL (ref 8.6–10.2)
Chloride: 101 mmol/L (ref 96–106)
Creatinine, Ser: 1.19 mg/dL (ref 0.76–1.27)
Globulin, Total: 2.6 g/dL (ref 1.5–4.5)
Glucose: 88 mg/dL (ref 70–99)
Potassium: 4.5 mmol/L (ref 3.5–5.2)
Sodium: 140 mmol/L (ref 134–144)
Total Protein: 7.2 g/dL (ref 6.0–8.5)
eGFR: 68 mL/min/{1.73_m2} (ref >59)

## 2022-09-27 LAB — HEPATITIS C ANTIBODY: Hep C Virus Ab: NONREACTIVE

## 2022-09-27 LAB — HIV ANTIBODY (ROUTINE TESTING W REFLEX): HIV Screen 4th Generation wRfx: NONREACTIVE

## 2022-09-27 NOTE — Progress Notes (Signed)
Hi Jason Frye. It was nice to meet you yesterday.  Your lab work looks good.  Your cholesterol has improved.  However, your Cardiac risk score is sill in the high risk range.  Indicating you should be on the higher dose statin like Rosuvastatin.  If you decide you would still like to go back to the Simvastatin please let me know.  Otherwise, continue with your current medication regimen.  Follow up as discussed.  Please let me know if you have any questions.   The 10-year ASCVD risk score (Arnett DK, et al., 2019) is: 12.2%   Values used to calculate the score:     Age: 67 years     Sex: Male     Is Non-Hispanic African American: No     Diabetic: No     Tobacco smoker: No     Systolic Blood Pressure: [id number] mmHg     Is BP treated: No     HDL Cholesterol: 40 mg/dL     Total Cholesterol: 178 mg/dL

## 2022-09-28 ENCOUNTER — Encounter: Payer: Self-pay | Admitting: Nurse Practitioner

## 2022-10-01 MED ORDER — ROSUVASTATIN CALCIUM 10 MG PO TABS: 10.0000 mg | ORAL_TABLET | Freq: Every day | ORAL | 1 refills | Status: DC

## 2023-02-06 ENCOUNTER — Ambulatory Visit: Payer: Self-pay

## 2023-02-06 ENCOUNTER — Telehealth: Payer: No Typology Code available for payment source | Admitting: Nurse Practitioner

## 2023-02-06 DIAGNOSIS — U071 COVID-19: Secondary | ICD-10-CM | POA: Diagnosis not present

## 2023-02-06 MED ORDER — MOLNUPIRAVIR EUA 200MG CAPSULE: 4.0000 | ORAL_CAPSULE | Freq: Two times a day (BID) | ORAL | 0 refills | Status: AC

## 2023-02-06 NOTE — Patient Instructions (Signed)
  Jason Frye, thank you for joining [name], FNP for today's virtual visit.  While this provider is not your primary care provider (PCP), if your PCP is located in our provider database this encounter information will be shared with them immediately following your visit.   A [facility] MyChart account gives you access to today's visit and all your visits, tests, and labs performed at [facility] " click here if you don't have a [facility] MyChart account or go to [email or web address]  Consent: (Patient) Jason Frye provided verbal consent for this virtual visit at the beginning of the encounter.  Current Medications:  Current Outpatient Medications:    molnupiravir EUA (LAGEVRIO) 200 mg CAPS capsule, Take 4 capsules (800 mg total) by mouth 2 (two) times daily for 5 days., Disp: 40 capsule, Rfl: 0   B Complex Vitamins (VITAMIN B-COMPLEX) TABS, Take 1 tablet by mouth daily., Disp: , Rfl:    meloxicam (MOBIC) 15 MG tablet, Take 1 tablet by mouth daily., Disp: , Rfl:    Multiple Vitamin (MULTI-VITAMINS) TABS, Take by mouth., Disp: , Rfl:    rosuvastatin (CRESTOR) 10 MG tablet, Take 1 tablet (10 mg total) by mouth daily., Disp: 90 tablet, Rfl: 1   Medications ordered in this encounter:  Meds ordered this encounter  Medications   molnupiravir EUA (LAGEVRIO) 200 mg CAPS capsule    Sig: Take 4 capsules (800 mg total) by mouth 2 (two) times daily for 5 days.    Dispense:  40 capsule    Refill:  0    Order Specific Question:   Supervising Provider    Answer:   Merrilee Jansky [id number]     *If you need refills on other medications prior to your next appointment, please contact your pharmacy*  Follow-Up: Call back or seek an in-person evaluation if the symptoms worsen or if the condition fails to improve as anticipated.  [facility] [phone]  Other Instructions 1. Take meds as prescribed 2. Use a cool mist humidifier especially  during the winter months and when heat has been humid. 3. Use saline nose sprays frequently 4. Saline irrigations of the nose can be very helpful if done frequently.  * 4X daily for 1 week*  * Use of a nettie pot can be helpful with this. Follow directions with this* 5. Drink plenty of fluids 6. Keep thermostat turn down low 7.For any cough or congestion- mucinex 8. For fever or aces or pains- take tylenol or ibuprofen appropriate for age and weight.  * for fevers greater than 101 orally you may alternate ibuprofen and tylenol every  3 hours.      If you have been instructed to have an in-person evaluation today at a local Urgent Care facility, please use the link below. It will take you to a list of all of our available [facility], including address, phone number and hours of operation. Please do not delay care.  [facility]  If you or a family member do not have a primary care provider, use the link below to schedule a visit and establish care. When you choose a [facility] primary care physician or advanced practice provider, you gain a long-term partner in health. Find a Primary Care Provider  Learn more about [facility] in-office and virtual care options:  - Get Care Now

## 2023-02-06 NOTE — Telephone Encounter (Signed)
Patient says he started showing symptoms of Covid on Monday afternoon and tested positive. Patient requested virtual appointment with provider. No virtual appointments available until Friday.    Chief Complaint: COVID positive today Symptoms: Headache, body aches, mild cough, fatigue Frequency: Monday Pertinent Negatives: Patient denies  Disposition: [] ED /[] Urgent Care (no appt availability in office) / [] Appointment(In office/virtual)/ [x]  [facility]/ [] Home Care/ [] Refused Recommended Disposition /[] [facility]/ []  Follow-up with PCP Additional Notes: No availability in the practice.  Reason for Disposition  MILD difficulty breathing (e.g., minimal/no SOB at rest, SOB with walking, pulse <100)  Answer Assessment - Initial Assessment Questions 1. COVID-19 DIAGNOSIS: "How do you know that you have COVID?" (e.g., positive lab test or self-test, diagnosed by doctor or NP/PA, symptoms after exposure).     Home test 2. COVID-19 EXPOSURE: "Was there any known exposure to COVID before the symptoms began?" CDC Definition of close contact: within 6 feet (2 meters) for a total of 15 minutes or more over a 24-hour period.      No 3. ONSET: "When did the COVID-19 symptoms start?"      Monday 4. WORST SYMPTOM: "What is your worst symptom?" (e.g., cough, fever, shortness of breath, muscle aches)      5. COUGH: "Do you have a cough?" If Yes, ask: "How bad is the cough?"       Mild 6. FEVER: "Do you have a fever?" If Yes, ask: "What is your temperature, how was it measured, and when did it start?"     Yes 7. RESPIRATORY STATUS: "Describe your breathing?" (e.g., normal; shortness of breath, wheezing, unable to speak)      No 8. BETTER-SAME-WORSE: "Are you getting better, staying the same or getting worse compared to yesterday?"  If getting worse, ask, "In what way?"     Same 9. OTHER SYMPTOMS: "Do you have any other symptoms?"  (e.g., chills, fatigue, headache, loss of smell or  taste, muscle pain, sore throat)     Headache, fatigue, body aches 10. HIGH RISK DISEASE: "Do you have any chronic medical problems?" (e.g., asthma, heart or lung disease, weak immune system, obesity, etc.)       No 11. VACCINE: "Have you had the COVID-19 vaccine?" If Yes, ask: "Which one, how many shots, when did you get it?"       N/a 12. PREGNANCY: "Is there any chance you are pregnant?" "When was your last menstrual period?"       No 13. O2 SATURATION MONITOR:  "Do you use an oxygen saturation monitor (pulse oximeter) at home?" If Yes, ask "What is your reading (oxygen level) today?" "What is your usual oxygen saturation reading?" (e.g., 95%)       No  Protocols used: Coronavirus (COVID-19) Diagnosed or Suspected-A-AH

## 2023-02-06 NOTE — Progress Notes (Signed)
Virtual Visit Consent   Jason Frye, you are scheduled for a virtual visit with Mary-Margaret Daphine Deutscher, FNP, a [facility] provider, today.     Just as with appointments in the office, your consent must be obtained to participate.  Your consent will be active for this visit and any virtual visit you may have with one of our providers in the next 365 days.     If you have a MyChart account, a copy of this consent can be sent to you electronically.  All virtual visits are billed to your insurance company just like a traditional visit in the office.    As this is a virtual visit, video technology does not allow for your provider to perform a traditional examination.  This may limit your provider's ability to fully assess your condition.  If your provider identifies any concerns that need to be evaluated in person or the need to arrange testing (such as labs, EKG, etc.), we will make arrangements to do so.     Although advances in technology are sophisticated, we cannot ensure that it will always work on either your end or our end.  If the connection with a video visit is poor, the visit may have to be switched to a telephone visit.  With either a video or telephone visit, we are not always able to ensure that we have a secure connection.     I need to obtain your verbal consent now.   Are you willing to proceed with your visit today? YES   Jason Frye has provided verbal consent on 02/06/2023 for a virtual visit (video or telephone).   Mary-Margaret Daphine Deutscher, FNP   Date: 02/06/2023 10:28 AM   Virtual Visit via Video Note   I, Mary-Margaret Daphine Deutscher, connected with Jason Frye ([id number], 01-Dec-1955) on 02/06/23 at 10:30 AM EDT by a video-enabled telemedicine application and verified that I am speaking with the correct person using two identifiers.  Location: Patient: Virtual Visit Location Patient: Home Provider: Virtual Visit Location Provider: Mobile   I discussed the limitations of  evaluation and management by telemedicine and the availability of in person appointments. The patient expressed understanding and agreed to proceed.    History of Present Illness: Jason Frye is a 67 y.o. who identifies as a male who was assigned male at birth, and is being seen today for covid positive .  HPI: URI  This is a new problem. The current episode started in the past 7 days. The problem has been gradually worsening. The maximum temperature recorded prior to his arrival was 100.4 - 100.9 F. The fever has been present for Less than 1 day. Associated symptoms include congestion, coughing and rhinorrhea. Pertinent negatives include no sinus pain. Associated symptoms comments: Achy all over. He has tried nothing for the symptoms. The treatment provided no relief.   Covid positive this morning Review of Systems  HENT:  Positive for congestion and rhinorrhea. Negative for sinus pain.   Respiratory:  Positive for cough.     Problems:  Patient Active Problem List   Diagnosis Date Noted   Primary hypertension 05/11/2021   Cardiac murmur 01/04/2021   SOB (shortness of breath) 01/04/2021   Hyperlipidemia 01/04/2021   DISH (diffuse idiopathic skeletal hyperostosis) 01/04/2021   Other fatigue 01/04/2021    Allergies:  Allergies  Allergen Reactions   Penicillins    Medications:  Current Outpatient Medications:    B Complex Vitamins (VITAMIN B-COMPLEX) TABS, Take 1 tablet  by mouth daily., Disp: , Rfl:    meloxicam (MOBIC) 15 MG tablet, Take 1 tablet by mouth daily., Disp: , Rfl:    Multiple Vitamin (MULTI-VITAMINS) TABS, Take by mouth., Disp: , Rfl:    rosuvastatin (CRESTOR) 10 MG tablet, Take 1 tablet (10 mg total) by mouth daily., Disp: 90 tablet, Rfl: 1  Observations/Objective: Patient is well-developed, well-nourished in no acute distress.  Resting comfortably  at home.  Head is normocephalic, atraumatic.  No labored breathing.  Speech is clear and coherent with logical  content.  Patient is alert and oriented at baseline.  Raspy voice No cough during visit  Assessment and Plan:  Jason Frye in today with chief complaint of No chief complaint on file.   1. Positive self-administered antigen test for COVID-19 1. Take meds as prescribed 2. Use a cool mist humidifier especially during the winter months and when heat has been humid. 3. Use saline nose sprays frequently 4. Saline irrigations of the nose can be very helpful if done frequently.  * 4X daily for 1 week*  * Use of a nettie pot can be helpful with this. Follow directions with this* 5. Drink plenty of fluids 6. Keep thermostat turn down low 7.For any cough or congestion- mucinex 8. For fever or aces or pains- take tylenol or ibuprofen appropriate for age and weight.  * for fevers greater than 101 orally you may alternate ibuprofen and tylenol every  3 hours.     Follow Up Instructions: I discussed the assessment and treatment plan with the patient. The patient was provided an opportunity to ask questions and all were answered. The patient agreed with the plan and demonstrated an understanding of the instructions.  A copy of instructions were sent to the patient via MyChart.  The patient was advised to call back or seek an in-person evaluation if the symptoms worsen or if the condition fails to improve as anticipated.  Time:  I spent 6 minutes with the patient via telehealth technology discussing the above problems/concerns.    Mary-Margaret Daphine Deutscher, FNP

## 2023-03-25 ENCOUNTER — Other Ambulatory Visit: Payer: Self-pay | Admitting: Nurse Practitioner

## 2023-03-26 NOTE — Telephone Encounter (Signed)
Requested Prescriptions  Pending Prescriptions Disp Refills   rosuvastatin (CRESTOR) 10 MG tablet [Pharmacy Med Name: ROSUVASTATIN CALCIUM 10 MG TAB] 90 tablet 1    Sig: TAKE 1 TABLET BY MOUTH EVERY DAY     Cardiovascular:  Antilipid - Statins 2 Failed - 03/25/2023  2:45 PM      Failed - Lipid Panel in normal range within the last 12 months    Cholesterol, Total  Date Value Ref Range Status  09/26/2022 178 100 - 199 mg/dL Final   LDL Chol Calc (NIH)  Date Value Ref Range Status  09/26/2022 101 (H) 0 - 99 mg/dL Final   HDL  Date Value Ref Range Status  09/26/2022 40 >39 mg/dL Final   Triglycerides  Date Value Ref Range Status  09/26/2022 217 (H) 0 - 149 mg/dL Final         Passed - Cr in normal range and within 360 days    Creatinine, Ser  Date Value Ref Range Status  09/26/2022 1.19 0.76 - 1.27 mg/dL Final         Passed - Patient is not pregnant      Passed - Valid encounter within last 12 months    Recent Outpatient Visits           6 months ago Hyperlipidemia, unspecified hyperlipidemia type   [place] [facility] Larae Grooms, NP   1 year ago Acute conjunctivitis of both eyes, unspecified acute conjunctivitis type   [place] [facility] Vigg, Avanti, MD   1 year ago Primary hypertension   [place] [facility] Vigg, Avanti, MD   2 years ago COVID-19   [place] [facility] Vigg, Avanti, MD   2 years ago Diastolic dysfunction   [place] [facility] Loura Pardon, MD

## 2023-05-28 ENCOUNTER — Encounter: Payer: Self-pay | Admitting: Nurse Practitioner

## 2023-05-28 ENCOUNTER — Ambulatory Visit (INDEPENDENT_AMBULATORY_CARE_PROVIDER_SITE_OTHER): Payer: No Typology Code available for payment source | Admitting: Nurse Practitioner

## 2023-05-28 VITALS — BP 117/77 | HR 67 | Temp 98.1°F | Ht 71.0 in | Wt 244.6 lb

## 2023-05-28 DIAGNOSIS — H6993 Unspecified Eustachian tube disorder, bilateral: Secondary | ICD-10-CM | POA: Diagnosis not present

## 2023-05-28 NOTE — Progress Notes (Signed)
BP 117/77 (BP Location: Left Arm, Patient Position: Sitting, Cuff Size: Large)   Pulse 67   Temp 98.1 F (36.7 C) (Oral)   Ht 5\' 11"  (1.803 m)   Wt 244 lb 9.6 oz (110.9 kg)   SpO2 97%   BMI 34.11 kg/m    Subjective:    Patient ID: Jason Frye, male    DOB: 01-17-1956, 67 y.o.   MRN: [id number]  HPI: Jason Frye is a 67 y.o. male  Chief Complaint  Patient presents with   Ear Pain    Pain started over the weekend, left over right, started with a little congestion and then the pain started, intermittently    EAR PAIN Duration: days (started over the weekend) Involved ear(s): left Severity:  1/10  Quality:  pressure-like Fever: no Otorrhea: no Upper respiratory infection symptoms: yes Pruritus: yes Hearing loss: no Water immersion no Using Q-tips: no Recurrent otitis media: no Status: stable Treatments attempted: none  Relevant past medical, surgical, family and social history reviewed and updated as indicated. Interim medical history since our last visit reviewed. Allergies and medications reviewed and updated.  Review of Systems  Constitutional:  Negative for fever.  HENT:  Positive for congestion and ear pain. Negative for ear discharge.     Per HPI unless specifically indicated above     Objective:    BP 117/77 (BP Location: Left Arm, Patient Position: Sitting, Cuff Size: Large)   Pulse 67   Temp 98.1 F (36.7 C) (Oral)   Ht 5\' 11"  (1.803 m)   Wt 244 lb 9.6 oz (110.9 kg)   SpO2 97%   BMI 34.11 kg/m   Wt Readings from Last 3 Encounters:  05/28/23 244 lb 9.6 oz (110.9 kg)  09/26/22 228 lb 8 oz (103.6 kg)  11/21/21 254 lb 9.6 oz (115.5 kg)    Physical Exam Vitals and nursing note reviewed.  Constitutional:      General: He is not in acute distress.    Appearance: Normal appearance. He is not ill-appearing, toxic-appearing or diaphoretic.  HENT:     Head: Normocephalic.     Right Ear: External ear normal. No drainage or tenderness. A middle  ear effusion is present. Tympanic membrane is not erythematous.     Left Ear: External ear normal. No drainage or tenderness. A middle ear effusion is present. Tympanic membrane is not erythematous.     Nose: Nose normal. No congestion or rhinorrhea.     Mouth/Throat:     Mouth: Mucous membranes are moist.  Eyes:     General:        Right eye: No discharge.        Left eye: No discharge.     Extraocular Movements: Extraocular movements intact.     Conjunctiva/sclera: Conjunctivae normal.     Pupils: Pupils are equal, round, and reactive to light.  Cardiovascular:     Rate and Rhythm: Normal rate and regular rhythm.     Heart sounds: No murmur heard. Pulmonary:     Effort: Pulmonary effort is normal. No respiratory distress.     Breath sounds: Normal breath sounds. No wheezing, rhonchi or rales.  Abdominal:     General: Abdomen is flat. Bowel sounds are normal.  Musculoskeletal:     Cervical back: Normal range of motion and neck supple.  Skin:    General: Skin is warm and dry.     Capillary Refill: Capillary refill takes less than 2 seconds.  Neurological:  General: No focal deficit present.     Mental Status: He is alert and oriented to person, place, and time.  Psychiatric:        Mood and Affect: Mood normal.        Behavior: Behavior normal.        Thought Content: Thought content normal.        Judgment: Judgment normal.     Results for orders placed or performed in visit on 09/26/22  Comp Met (CMET)  Result Value Ref Range   Glucose 88 70 - 99 mg/dL   BUN 14 8 - 27 mg/dL   Creatinine, Ser 6.64 0.76 - 1.27 mg/dL   eGFR 68 >40 HK/VQQ/5.95   BUN/Creatinine Ratio 12 10 - 24   Sodium 140 134 - 144 mmol/L   Potassium 4.5 3.5 - 5.2 mmol/L   Chloride 101 96 - 106 mmol/L   CO2 25 20 - 29 mmol/L   Calcium 9.9 8.6 - 10.2 mg/dL   Total Protein 7.2 6.0 - 8.5 g/dL   Albumin 4.6 3.9 - 4.9 g/dL   Globulin, Total 2.6 1.5 - 4.5 g/dL   Albumin/Globulin Ratio 1.8 1.2 - 2.2    Bilirubin Total 0.3 0.0 - 1.2 mg/dL   Alkaline Phosphatase 88 44 - 121 IU/L   AST 23 0 - 40 IU/L   ALT 16 0 - 44 IU/L  Lipid Profile  Result Value Ref Range   Cholesterol, Total 178 100 - 199 mg/dL   Triglycerides 638 (H) 0 - 149 mg/dL   HDL 40 >75 mg/dL   VLDL Cholesterol Cal 37 5 - 40 mg/dL   LDL Chol Calc (NIH) 643 (H) 0 - 99 mg/dL   Chol/HDL Ratio 4.5 0.0 - 5.0 ratio  HIV Antibody (routine testing w rflx)  Result Value Ref Range   HIV Screen 4th Generation wRfx Non Reactive Non Reactive  Hepatitis C Antibody  Result Value Ref Range   Hep C Virus Ab Non Reactive Non Reactive      Assessment & Plan:   Problem List Items Addressed This Visit   None Visit Diagnoses     Dysfunction of both eustachian tubes    -  Primary   Recommend OTC antihistamine.  Follow up if symptoms not improved.        Follow up plan: Return if symptoms worsen or fail to improve.

## 2023-08-15 ENCOUNTER — Ambulatory Visit: Payer: Self-pay

## 2023-08-15 NOTE — Telephone Encounter (Signed)
  Chief Complaint: dizziness Symptoms: dizziness with movement worse, fatigue and weak feeling, BP 142/76, HR 85 Frequency: today Pertinent Negatives: NA Disposition: [] ED /[x] Urgent Care (no appt availability in office) / [] Appointment(In office/virtual)/ []  [facility]/ [] Home Care/ [] Refused Recommended Disposition /[] [facility]/ []  Follow-up with PCP Additional Notes: pt hasn't had these sx before. States has been constant since starting. BP normally better than that. Offered appt tomorrow at 1540 with Dr. Evelene Croon. Pt wanted to be seen earlier but no appts available. Offered pt UC, advised he could do walk in appt today since low wait time. Pt preferred to do that so he can be seen today. Will go to UC.   Reason for Disposition  [1] MODERATE dizziness (e.g., interferes with normal activities) AND [2] has NOT been evaluated by doctor (or NP/PA) for this  (Exception: Dizziness caused by heat exposure, sudden standing, or poor fluid intake.)  Answer Assessment - Initial Assessment Questions 1. DESCRIPTION: "Describe your dizziness."     lightheadedness 2. LIGHTHEADED: "Do you feel lightheaded?" (e.g., somewhat faint, woozy, weak upon standing)     Woozy  4. SEVERITY: "How bad is it?"  "Do you feel like you are going to faint?" "Can you stand and walk?"   - MILD: Feels slightly dizzy, but walking normally.   - MODERATE: Feels unsteady when walking, but not falling; interferes with normal activities (e.g., school, work).   - SEVERE: Unable to walk without falling, or requires assistance to walk without falling; feels like passing out now.      Mild  5. ONSET:  "When did the dizziness begin?"     Today  6. AGGRAVATING FACTORS: "Does anything make it worse?" (e.g., standing, change in head position)     Movement  10. OTHER SYMPTOMS: "Do you have any other symptoms?" (e.g., fever, chest pain, vomiting, diarrhea, bleeding)       Fatigue and weak, 142/76, HR 85  Protocols  used: Dizziness - Lightheadedness-A-AH

## 2023-08-15 NOTE — Telephone Encounter (Signed)
FYI:  See triage, pt going to UC today

## 2023-08-15 NOTE — Telephone Encounter (Signed)
Pt called back. Pt went to UC,and they told him that it would be better fro him to see his PCP for this. They would not be able to do labs, etc.  Appt made for 8:20 tomorrow with Erin Mecum. Pt will call back - seek immediate care if needed.

## 2023-08-16 ENCOUNTER — Ambulatory Visit: Payer: Medicare Other | Admitting: Physician Assistant

## 2023-08-16 ENCOUNTER — Encounter: Payer: Self-pay | Admitting: Physician Assistant

## 2023-08-16 VITALS — BP 138/70 | HR 85 | Resp 16 | Ht 71.0 in | Wt 247.0 lb

## 2023-08-16 DIAGNOSIS — R42 Dizziness and giddiness: Secondary | ICD-10-CM | POA: Diagnosis not present

## 2023-08-16 MED ORDER — MECLIZINE HCL 12.5 MG PO TABS: 12.5000 mg | ORAL_TABLET | Freq: Three times a day (TID) | ORAL | 0 refills | Status: DC | PRN

## 2023-08-16 NOTE — Patient Instructions (Addendum)
I also recommend adding an antihistamine to your daily regimen This includes medications like Claritin, Allegra, Zyrtec- the generics of these work very well and are usually less expensive I recommend using Flonase nasal spray - 2 puffs twice per day to help with your nasal congestion The antihistamines and Flonase can take a few weeks to provide significant relief from allergy symptoms but should start to provide some benefit soon.  If these measures are not providing relief in the next 1-2 weeks let us know so we can discuss further options

## 2023-08-16 NOTE — Progress Notes (Signed)
Acute Office Visit   Patient: Jason Frye   DOB: 08-29-1955   68 y.o. Male  MRN: [id number] Visit Date: 08/16/2023  Today's healthcare provider: Oswaldo Conroy Brezlyn Manrique, PA-C  Introduced myself to the patient as a Secondary school teacher and provided education on APPs in clinical practice.    Chief Complaint  Patient presents with   Dizziness    Started fatigue earlier in week, yesterday dizziness set in.   Subjective    HPI HPI     Dizziness    Additional comments: Started fatigue earlier in week, yesterday dizziness set in.      Last edited by Dollene Primrose, CMA on 08/16/2023  8:15 AM.        Jason Frye Reports feeling tired throughout the course of the week. States yesterday he started to feel dizzy and lightheaded  He reports position changes at "normal speed" aggravate the dizziness but if he does so slowly, he is better  Duration: days Description of symptoms: room spinning Duration of episode: seconds Dizziness frequency: no history of the same Provoking factors: with movement  Triggered by rolling over in bed: yes Triggered by bending over: yes Aggravated by head movement: yes Aggravated by exertion, coughing, loud noises: no Recent head injury: no Recent or current viral symptoms:  earlier this week he had some fatigue. About a month ago he had a cold. Early in Nov he had some aural fullness and started an allergy medication  History of vasovagal episodes: no Nausea: no Vomiting: no Tinnitus: no Hearing loss: no Aural fullness: yes Headache: yes- has been going to chiropractor for this  Photophobia/phonophobia: no Unsteady gait: no Postural instability: no Diplopia, dysarthria, dysphagia or weakness: no Related to exertion: no Pallor: no Diaphoresis: no Dyspnea: no Chest pain: no   Medications: Outpatient Medications Prior to Visit  Medication Sig   B Complex Vitamins (VITAMIN B-COMPLEX) TABS Take 1 tablet by mouth daily.   meloxicam (MOBIC) 15 MG tablet  Take 1 tablet by mouth daily.   Multiple Vitamin (MULTI-VITAMINS) TABS Take by mouth.   rosuvastatin (CRESTOR) 10 MG tablet TAKE 1 TABLET BY MOUTH EVERY DAY   No facility-administered medications prior to visit.    Review of Systems  Constitutional:  Positive for fatigue.  Neurological:  Positive for dizziness and light-headedness.        Objective    BP 138/70   Pulse 85   Resp 16   Ht 5\' 11"  (1.803 m)   Wt 247 lb (112 kg)   SpO2 99%   BMI 34.45 kg/m     Physical Exam Vitals reviewed.  Constitutional:      Appearance: Normal appearance.  HENT:     Head: Normocephalic and atraumatic.     Right Ear: Hearing and ear canal normal. Tympanic membrane is retracted. Tympanic membrane is not injected, scarred, perforated, erythematous or bulging.     Left Ear: Hearing and ear canal normal. Tympanic membrane is retracted. Tympanic membrane is not injected, scarred, perforated, erythematous or bulging.     Mouth/Throat:     Lips: Pink.     Mouth: Mucous membranes are moist.     Pharynx: Oropharynx is clear. Uvula midline. No pharyngeal swelling, oropharyngeal exudate, posterior oropharyngeal erythema or postnasal drip.  Cardiovascular:     Rate and Rhythm: Normal rate and regular rhythm.     Heart sounds: Normal heart sounds.  Pulmonary:     Effort: Pulmonary effort is normal.  Breath sounds: Normal breath sounds. No decreased air movement. No decreased breath sounds, wheezing, rhonchi or rales.  Musculoskeletal:     Cervical back: Normal range of motion and neck supple.  Neurological:     General: No focal deficit present.     Mental Status: He is alert and oriented to person, place, and time.     GCS: GCS eye subscore is 4. GCS verbal subscore is 5. GCS motor subscore is 6.     Cranial Nerves: No cranial nerve deficit, dysarthria or facial asymmetry.     Motor: No weakness or tremor.     Comments: Comments: MENTAL STATUS: AAOx3, memory intact, fund of knowledge  appropriate   LANG/SPEECH: Naming and repetition intact, fluent, no dysarthria, follows 3-step commands, answers questions appropriately     CRANIAL NERVES:   II: Pupils equal and reactive, no RAPD   III, IV, VI: EOM intact, no gaze preference or deviation, no nystagmus.   V: normal sensation in V1, V2, and V3 segments bilaterally   VII: no asymmetry, no nasolabial fold flattening   VIII: normal hearing to speech   IX, X: normal palatal elevation, no uvular deviation   XI: 5/5 head turn and 5/5 shoulder shrug bilaterally   XII: midline tongue protrusion   MOTOR:  5/5 bilateral grip strength 5/5 strength dorsiflexion/plantarflexion b/l    STATION: normal stance, no truncal ataxia   GAIT: Normal   Psychiatric:        Mood and Affect: Mood normal.        Behavior: Behavior normal.        Thought Content: Thought content normal.        Judgment: Judgment normal.       No results found for any visits on 08/16/23.  Assessment & Plan      No follow-ups on file.      Problem List Items Addressed This Visit   None Visit Diagnoses       Postural dizziness    -  Primary   Relevant Medications   meclizine (ANTIVERT) 12.5 MG tablet      Acute, new onset Patient reports increased fatigue earlier in the week that was followed by development of dizziness with position changes  Suspect inner ear etiology given HPI and Physical exam  Recommend using antihistamine and flonase to assist with with symptoms. Will also provide instructions on completing Epley maneuver at home- recommend doing this in bed or on floor to prevent potential falls Will provide meclizine to assist with lingering dizziness If symptoms are not improving in the next 1-2 weeks, may need referral to Vestibular rehab for further evaluation and management Follow up as needed for persistent or progressing symptoms     No follow-ups on file.   I, Kerline Trahan E Rosmery Duggin, PA-C, have reviewed all documentation for this  visit. The documentation on 08/16/23 for the exam, diagnosis, procedures, and orders are all accurate and complete.   Jacquelin Hawking, MHS, PA-C [facility]

## 2023-08-21 ENCOUNTER — Telehealth: Payer: Self-pay | Admitting: Nurse Practitioner

## 2023-08-21 ENCOUNTER — Other Ambulatory Visit: Payer: Self-pay | Admitting: Physician Assistant

## 2023-08-21 NOTE — Telephone Encounter (Signed)
Medication Refill -  Most Recent Primary Care Visit:  Provider: Jacquelin Hawking E  Department: [facility]  Visit Type: OFFICE VISIT  Date: 08/16/2023  Medication: rosuvastatin (CRESTOR) 10 MG tablet   Pt has stated that [facility] on the bottle but they are saying that it does not have a refill available and can not refill. He was then denied by Denny Peon Mecum for refill based on how she wrote the rx. [facility] is missing [facility].    Disp Refills Start End   rosuvastatin (CRESTOR) 10 MG tablet [Pharmacy Med Name: ROSUVASTATIN CALCIUM 10 MG TAB] 90 tablet 1 08/21/2023 --   Request refused: Patient has requested refill too soon (03/26/23 #90/1 RF.)     Has the patient contacted their pharmacy? Yes   Is this the correct pharmacy for this prescription? Yes  This is the patient's preferred pharmacy:  [id number] [place], [place] - [address] [place] [address] Phone: [phone] Fax: [phone]   Has the prescription been filled recently? No  Is the patient out of the medication? Yes  Has the patient been seen for an appointment in the last year OR does the patient have an upcoming appointment? Yes  Can we respond through MyChart? Yes  Agent: Please be advised that Rx refills may take up to 3 business days. We ask that you follow-up with your pharmacy.

## 2023-08-21 NOTE — Telephone Encounter (Signed)
Rx was sent to pharmacy on 03/26/23 #90/1 RF.   Requested Prescriptions  Pending Prescriptions Disp Refills   rosuvastatin (CRESTOR) 10 MG tablet [Pharmacy Med Name: ROSUVASTATIN CALCIUM 10 MG TAB] 90 tablet 1    Sig: TAKE 1 TABLET BY MOUTH EVERY DAY     Cardiovascular:  Antilipid - Statins 2 Failed - 08/21/2023 12:44 PM      Failed - Lipid Panel in normal range within the last 12 months    Cholesterol, Total  Date Value Ref Range Status  09/26/2022 178 100 - 199 mg/dL Final   LDL Chol Calc (NIH)  Date Value Ref Range Status  09/26/2022 101 (H) 0 - 99 mg/dL Final   HDL  Date Value Ref Range Status  09/26/2022 40 >39 mg/dL Final   Triglycerides  Date Value Ref Range Status  09/26/2022 217 (H) 0 - 149 mg/dL Final         Passed - Cr in normal range and within 360 days    Creatinine, Ser  Date Value Ref Range Status  09/26/2022 1.19 0.76 - 1.27 mg/dL Final         Passed - Patient is not pregnant      Passed - Valid encounter within last 12 months    Recent Outpatient Visits           5 days ago Postural dizziness   [place] [facility] Mecum, Oswaldo Conroy, PA-C   2 months ago Dysfunction of both eustachian tubes   [facility] [facility] Larae Grooms, NP   10 months ago Hyperlipidemia, unspecified hyperlipidemia type   [place] [facility] Larae Grooms, NP   1 year ago Acute conjunctivitis of both eyes, unspecified acute conjunctivitis type   [place] [facility] Vigg, Avanti, MD   2 years ago Primary hypertension   [place] [facility] Loura Pardon, MD

## 2023-08-21 NOTE — Telephone Encounter (Signed)
[facility] and there is a refill left on the rosucastatin. Was advised by Reita May (pharmacist) they they are out of that med but will have a shipment come in tomorrow. Called pt and LM on VM with the above information and a callback number.

## 2023-11-15 ENCOUNTER — Other Ambulatory Visit: Payer: Self-pay | Admitting: Physician Assistant

## 2023-11-15 NOTE — Telephone Encounter (Signed)
 Requested medication (s) are due for refill today - yes  Requested medication (s) are on the active medication list -yes  Future visit scheduled -no  Last refill: 03/26/23 #90 1RF  Notes to clinic: fails lab protocol- over 1 year- 09/26/22  Requested Prescriptions  Pending Prescriptions Disp Refills   rosuvastatin  (CRESTOR ) 10 MG tablet [Pharmacy Med Name: ROSUVASTATIN  CALCIUM  10 MG TAB] 90 tablet 1    Sig: TAKE 1 TABLET BY MOUTH EVERY DAY     Cardiovascular:  Antilipid - Statins 2 Failed - 11/15/2023 12:45 PM      Failed - Cr in normal range and within 360 days    Creatinine, Ser  Date Value Ref Range Status  09/26/2022 1.19 0.76 - 1.27 mg/dL Final         Failed - Valid encounter within last 12 months    Recent Outpatient Visits   None            Failed - Lipid Panel in normal range within the last 12 months    Cholesterol, Total  Date Value Ref Range Status  09/26/2022 178 100 - 199 mg/dL Final   LDL Chol Calc (NIH)  Date Value Ref Range Status  09/26/2022 101 (H) 0 - 99 mg/dL Final   HDL  Date Value Ref Range Status  09/26/2022 40 >39 mg/dL Final   Triglycerides  Date Value Ref Range Status  09/26/2022 217 (H) 0 - 149 mg/dL Final         Passed - Patient is not pregnant         Requested Prescriptions  Pending Prescriptions Disp Refills   rosuvastatin  (CRESTOR ) 10 MG tablet [Pharmacy Med Name: ROSUVASTATIN  CALCIUM  10 MG TAB] 90 tablet 1    Sig: TAKE 1 TABLET BY MOUTH EVERY DAY     Cardiovascular:  Antilipid - Statins 2 Failed - 11/15/2023 12:45 PM      Failed - Cr in normal range and within 360 days    Creatinine, Ser  Date Value Ref Range Status  09/26/2022 1.19 0.76 - 1.27 mg/dL Final         Failed - Valid encounter within last 12 months    Recent Outpatient Visits   None            Failed - Lipid Panel in normal range within the last 12 months    Cholesterol, Total  Date Value Ref Range Status  09/26/2022 178 100 - 199 mg/dL Final    LDL Chol Calc (NIH)  Date Value Ref Range Status  09/26/2022 101 (H) 0 - 99 mg/dL Final   HDL  Date Value Ref Range Status  09/26/2022 40 >39 mg/dL Final   Triglycerides  Date Value Ref Range Status  09/26/2022 217 (H) 0 - 149 mg/dL Final         Passed - Patient is not pregnant

## 2023-11-15 NOTE — Telephone Encounter (Signed)
Patient is overdue for appointment. Please call to schedule and then route to provider for refill.

## 2023-11-25 NOTE — Telephone Encounter (Signed)
 Called patient. Left message for patient to call office to schedule and schedule appt for med refill.

## 2023-12-04 ENCOUNTER — Encounter: Payer: Self-pay | Admitting: Pediatrics

## 2023-12-04 ENCOUNTER — Ambulatory Visit: Admitting: Pediatrics

## 2023-12-04 VITALS — BP 117/73 | HR 76 | Temp 97.7°F | Wt 251.6 lb

## 2023-12-04 DIAGNOSIS — I10 Essential (primary) hypertension: Secondary | ICD-10-CM | POA: Diagnosis not present

## 2023-12-04 DIAGNOSIS — Z131 Encounter for screening for diabetes mellitus: Secondary | ICD-10-CM

## 2023-12-04 DIAGNOSIS — N50819 Testicular pain, unspecified: Secondary | ICD-10-CM | POA: Diagnosis not present

## 2023-12-04 DIAGNOSIS — Z13220 Encounter for screening for lipoid disorders: Secondary | ICD-10-CM

## 2023-12-04 DIAGNOSIS — E785 Hyperlipidemia, unspecified: Secondary | ICD-10-CM | POA: Diagnosis not present

## 2023-12-04 DIAGNOSIS — R350 Frequency of micturition: Secondary | ICD-10-CM

## 2023-12-04 LAB — URINALYSIS, ROUTINE W REFLEX MICROSCOPIC
Bilirubin, UA: NEGATIVE
Glucose, UA: NEGATIVE
Ketones, UA: NEGATIVE
Leukocytes,UA: NEGATIVE
Nitrite, UA: NEGATIVE
Protein,UA: NEGATIVE
RBC, UA: NEGATIVE
Specific Gravity, UA: 1.02 (ref 1.005–1.030)
Urobilinogen, Ur: 1 mg/dL (ref 0.2–1.0)
pH, UA: 6.5 (ref 5.0–7.5)

## 2023-12-04 MED ORDER — ROSUVASTATIN CALCIUM 10 MG PO TABS: 10.0000 mg | ORAL_TABLET | Freq: Every day | ORAL | 1 refills | Status: DC

## 2023-12-04 NOTE — Progress Notes (Signed)
 Office Visit  BP 117/73   Pulse 76   Temp 97.7 F (36.5 C) (Oral)   Wt 251 lb 9.6 oz (114.1 kg)   SpO2 97%   BMI 35.09 kg/m    Subjective:    Patient ID: Jason Frye, male    DOB: 07/12/1956, 68 y.o.   MRN: [id number]  HPI: [name] is a 68 y.o. male  Chief Complaint  Patient presents with   Testicle Pain    Patient states symptoms started last month    Urinary Frequency    Discussed the use of AI scribe software for clinical note transcription with the patient, who gave verbal consent to proceed.  History of Present Illness   [name] is a 68 year old male who presents with lower abdominal discomfort and increased urinary frequency.  He has experienced discomfort or an ache in the lower abdomen for about a month. The pain is described as sharp and short-lived, sometimes feeling like it originates from either the left or right testicle. The discomfort is not related to position changes such as lying down or standing, and there is no associated redness or color changes in the area. He has not felt any lumps or hardness in the testicles, describing them as 'squishy'.  He reports increased urinary frequency, including nocturia, where he gets up once or twice a night to urinate. This frequency is more than before, although it has not increased further recently. No burning sensation during urination.  Additionally, he mentions a change in bowel habits, with an increase from one to two bowel movements per day to two to three, with the third being looser than usual, though not complete diarrhea.  He is currently on rosuvastatin , which is being held by the pharmacy pending blood work. He has a history of hypertension, which he managed to control through weight loss, although he notes some weight has crept back.     Relevant past medical, surgical, family and social history reviewed and updated as indicated. Interim medical history since our last visit  reviewed. Allergies and medications reviewed and updated.  ROS per HPI unless specifically indicated above     Objective:     BP 117/73   Pulse 76   Temp 97.7 F (36.5 C) (Oral)   Wt 251 lb 9.6 oz (114.1 kg)   SpO2 97%   BMI 35.09 kg/m   Wt Readings from Last 3 Encounters:  12/04/23 251 lb 9.6 oz (114.1 kg)  08/16/23 247 lb (112 kg)  05/28/23 244 lb 9.6 oz (110.9 kg)     Physical Exam Constitutional:      Appearance: Normal appearance.  HENT:     Head: Normocephalic and atraumatic.  Eyes:     Pupils: Pupils are equal, round, and reactive to light.  Cardiovascular:     Rate and Rhythm: Normal rate and regular rhythm.     Pulses: Normal pulses.     Heart sounds: Normal heart sounds.  Pulmonary:     Effort: Pulmonary effort is normal.     Breath sounds: Normal breath sounds.  Abdominal:     General: Abdomen is flat.     Palpations: Abdomen is soft.  Musculoskeletal:        General: Normal range of motion.     Cervical back: Normal range of motion.  Skin:    General: Skin is warm and dry.     Capillary Refill: Capillary refill takes less than 2 seconds.  Neurological:  General: No focal deficit present.     Mental Status: He is alert. Mental status is at baseline.  Psychiatric:        Mood and Affect: Mood normal.        Behavior: Behavior normal.         12/04/2023    8:33 AM 05/28/2023    1:09 PM 09/26/2022   10:06 AM 11/21/2021   11:11 AM 02/01/2021   10:23 AM  Depression screen PHQ 2/9  Decreased Interest 0 0 0 0 0  Down, Depressed, Hopeless 0 0 0 0 0  PHQ - 2 Score 0 0 0 0 0  Altered sleeping 0 0 0 0 0  Tired, decreased energy 1 0 0 0 0  Change in appetite 0 0 0 0 0  Feeling bad or failure about yourself  0 0 0 0 0  Trouble concentrating 0 0 0 0 0  Moving slowly or fidgety/restless 0 0 0 0 0  Suicidal thoughts 0 0 0 0 0  PHQ-9 Score 1 0 0 0 0  Difficult doing work/chores Not difficult at all  Not difficult at all         12/04/2023    8:33 AM  05/28/2023    1:09 PM 09/26/2022   10:07 AM 11/21/2021   11:11 AM  GAD 7 : Generalized Anxiety Score  Nervous, Anxious, on Edge 0 0 0 0  Control/stop worrying 0 0 0 0  Worry too much - different things 0 0 0 0  Trouble relaxing 0 0 0 0  Restless 0 0 0 0  Easily annoyed or irritable 0 0 0 0  Afraid - awful might happen 0 0 0 0  Total GAD 7 Score 0 0 0 0  Anxiety Difficulty Not difficult at all  Not difficult at all Not difficult at all       Assessment & Plan:  Assessment & Plan   Hyperlipidemia, unspecified hyperlipidemia type Requesting refills. -     Rosuvastatin  Calcium ; Take 1 tablet (10 mg total) by mouth daily.  Dispense: 90 tablet; Refill: 1  Primary hypertension Blood pressure well-controlled with current regimen. Previous weight loss improved control. Rosuvastatin  monitored for dosage adjustment. - Continue current antihypertensive regimen. - Monitor blood pressure regularly. - Adjust rosuvastatin  dose if necessary. - Prescribe 90-day supply of rosuvastatin  with one refill.   Testicular discomfort Intermittent sharp pain in lower abdomen and testicular area for a month. Differential includes muscular strain or testicular issues. Ultrasound planned for further evaluation. - Order testicular ultrasound. - Schedule follow-up for ultrasound results. -     US  SCROTUM W/DOPPLER; Future  Frequent urination Increased urination frequency, nocturia once or twice. Differential includes elevated blood sugar or urinary issues. No dysuria. Urinalysis and blood tests planned. - Order urinalysis. - Order blood tests for blood sugar. - Include PSA test. -     Urinalysis, Routine w reflex microscopic -     Urine Culture -     Comprehensive metabolic panel with GFR -     PSA  Diabetes mellitus screening -     Hemoglobin A1c  Lipid screening -     Lipid panel   Follow up plan: Return in about 4 weeks (around 01/01/2024) for Physical.  Hadassah Letters, MD

## 2023-12-04 NOTE — Patient Instructions (Signed)
 Plan to gets labs and imaging

## 2023-12-05 LAB — COMPREHENSIVE METABOLIC PANEL WITH GFR
ALT: 26 IU/L (ref 0–44)
AST: 27 IU/L (ref 0–40)
Albumin: 4.7 g/dL (ref 3.9–4.9)
Alkaline Phosphatase: 70 IU/L (ref 44–121)
BUN/Creatinine Ratio: 15 (ref 10–24)
BUN: 19 mg/dL (ref 8–27)
Bilirubin Total: 0.4 mg/dL (ref 0.0–1.2)
CO2: 22 mmol/L (ref 20–29)
Calcium: 9.6 mg/dL (ref 8.6–10.2)
Chloride: 103 mmol/L (ref 96–106)
Creatinine, Ser: 1.26 mg/dL (ref 0.76–1.27)
Globulin, Total: 2.4 g/dL (ref 1.5–4.5)
Glucose: 97 mg/dL (ref 70–99)
Potassium: 4.5 mmol/L (ref 3.5–5.2)
Sodium: 140 mmol/L (ref 134–144)
Total Protein: 7.1 g/dL (ref 6.0–8.5)
eGFR: 63 mL/min/{1.73_m2} (ref >59)

## 2023-12-05 LAB — HEMOGLOBIN A1C
Est. average glucose Bld gHb Est-mCnc: 126 mg/dL
Hgb A1c MFr Bld: 6 % — ABNORMAL HIGH (ref 4.8–5.6)

## 2023-12-05 LAB — LIPID PANEL
Chol/HDL Ratio: 4.2 ratio (ref 0.0–5.0)
Cholesterol, Total: 173 mg/dL (ref 100–199)
HDL: 41 mg/dL (ref >39)
LDL Chol Calc (NIH): 94 mg/dL (ref 0–99)
Triglycerides: 225 mg/dL — ABNORMAL HIGH (ref 0–149)
VLDL Cholesterol Cal: 38 mg/dL (ref 5–40)

## 2023-12-05 LAB — PSA: Prostate Specific Ag, Serum: 0.9 ng/mL (ref 0.0–4.0)

## 2023-12-06 ENCOUNTER — Encounter: Payer: Self-pay | Admitting: Pediatrics

## 2023-12-06 LAB — URINE CULTURE: Organism ID, Bacteria: NO GROWTH

## 2023-12-09 ENCOUNTER — Ambulatory Visit
Admission: RE | Admit: 2023-12-09 | Discharge: 2023-12-09 | Disposition: A | Discharge status: Home / Self Care | Source: Ambulatory Visit | Attending: Pediatrics | Admitting: Pediatrics

## 2023-12-09 DIAGNOSIS — N50819 Testicular pain, unspecified: Secondary | ICD-10-CM | POA: Diagnosis present

## 2023-12-10 ENCOUNTER — Ambulatory Visit: Payer: Self-pay | Admitting: Pediatrics

## 2024-01-07 ENCOUNTER — Encounter: Payer: Self-pay | Admitting: Nurse Practitioner

## 2024-01-07 ENCOUNTER — Ambulatory Visit (INDEPENDENT_AMBULATORY_CARE_PROVIDER_SITE_OTHER): Admitting: Nurse Practitioner

## 2024-01-07 VITALS — BP 114/56 | HR 64 | Wt 244.0 lb

## 2024-01-07 DIAGNOSIS — Z136 Encounter for screening for cardiovascular disorders: Secondary | ICD-10-CM | POA: Diagnosis not present

## 2024-01-07 DIAGNOSIS — E785 Hyperlipidemia, unspecified: Secondary | ICD-10-CM | POA: Diagnosis not present

## 2024-01-07 DIAGNOSIS — R7303 Prediabetes: Secondary | ICD-10-CM | POA: Insufficient documentation

## 2024-01-07 DIAGNOSIS — Z0000 Encounter for general adult medical examination without abnormal findings: Secondary | ICD-10-CM | POA: Diagnosis not present

## 2024-01-07 DIAGNOSIS — I10 Essential (primary) hypertension: Secondary | ICD-10-CM | POA: Diagnosis not present

## 2024-01-07 NOTE — Assessment & Plan Note (Signed)
 A1c of 6.0% at visit in May.  Has made lifestyle changes. Will recheck labs at next visit.

## 2024-01-07 NOTE — Assessment & Plan Note (Signed)
Chronic.  Controlled.  Continue with current medication regimen of Rosuvastatin daily.  Labs ordered today.  Return to clinic in 6 months for reevaluation.  Call sooner if concerns arise.   

## 2024-01-07 NOTE — Progress Notes (Signed)
 BP (!) 114/56   Pulse 64   Wt 244 lb (110.7 kg)   BMI 34.03 kg/m    Subjective:    Patient ID: Jason Frye, male    DOB: Jul 15, 1956, 68 y.o.   MRN: [id number]  HPI: Jason Frye is a 68 y.o. male presenting on 01/07/2024 for comprehensive medical examination. Current medical complaints include:prediabetes  He currently lives with: Interim Problems from his last visit: no  HYPERTENSION / HYPERLIPIDEMIA Doing well with Rousvastatin.  Satisfied with current treatment? yes Duration of hypertension: years BP monitoring frequency: regularly BP range: <120/75 BP medication side effects: no Past BP meds: none Duration of hyperlipidemia: years Cholesterol medication side effects: no Cholesterol supplements: none Past cholesterol medications: rosuvastatin  (crestor ) and simvastatin  (zocor ) Medication compliance: excellent compliance Aspirin: no Recent stressors: no Recurrent headaches: no Visual changes: no Palpitations: no Dyspnea: no Chest pain: no Lower extremity edema: no Dizzy/lightheaded: no  The 10-year ASCVD risk score (Arnett DK, et al., 2019) is: 13.7%   Values used to calculate the score:     Age: 40 years     Sex: Male     Is Non-Hispanic African American: No     Diabetic: No     Tobacco smoker: No     Systolic Blood Pressure: 127 mmHg     Is BP treated: No     HDL Cholesterol: 36 mg/dL     Total Cholesterol: 192 mg/dL    Depression Screen done today and results listed below:     12/04/2023    8:33 AM 05/28/2023    1:09 PM 09/26/2022   10:06 AM 11/21/2021   11:11 AM 02/01/2021   10:23 AM  Depression screen PHQ 2/9  Decreased Interest 0 0 0 0 0  Down, Depressed, Hopeless 0 0 0 0 0  PHQ - 2 Score 0 0 0 0 0  Altered sleeping 0 0 0 0 0  Tired, decreased energy 1 0 0 0 0  Change in appetite 0 0 0 0 0  Feeling bad or failure about yourself  0 0 0 0 0  Trouble concentrating 0 0 0 0 0  Moving slowly or fidgety/restless 0 0 0 0 0  Suicidal thoughts 0 0  0 0 0  PHQ-9 Score 1 0 0 0 0  Difficult doing work/chores Not difficult at all  Not difficult at all      The patient does not have a history of falls. I did complete a risk assessment for falls. A plan of care for falls was documented.   Past Medical History:  Past Medical History:  Diagnosis Date   Allergy    Arthritis    Glaucoma    Hyperlipidemia    Hypertension     Surgical History:  Past Surgical History:  Procedure Laterality Date   COLONOSCOPY WITH PROPOFOL  N/A 06/16/2021   Procedure: COLONOSCOPY WITH PROPOFOL ;  Surgeon: Irby Mannan, MD;  Location: [facility] ENDOSCOPY;  Service: Endoscopy;  Laterality: N/A;   TONSILLECTOMY      Medications:  Current Outpatient Medications on File Prior to Visit  Medication Sig   B Complex Vitamins (VITAMIN B-COMPLEX) TABS Take 1 tablet by mouth daily.   meloxicam  (MOBIC ) 15 MG tablet Take 1 tablet by mouth daily.   Multiple Vitamin (MULTI-VITAMINS) TABS Take by mouth.   rosuvastatin  (CRESTOR ) 10 MG tablet Take 1 tablet (10 mg total) by mouth daily.   No current facility-administered medications on file prior to visit.  Allergies:  Allergies  Allergen Reactions   Penicillins     Social History:  Social History   Socioeconomic History   Marital status: Divorced    Spouse name: Not on file   Number of children: Not on file   Years of education: Not on file   Highest education level: Bachelor's degree (e.g., BA, AB, BS)  Occupational History   Not on file  Tobacco Use   Smoking status: Never   Smokeless tobacco: Never  Vaping Use   Vaping status: Never Used  Substance and Sexual Activity   Alcohol use: Yes    Comment: on occasion   Drug use: Never   Sexual activity: Not Currently  Other Topics Concern   Not on file  Social History Narrative   Not on file   Social Drivers of Health   Financial Resource Strain: Low Risk  (08/15/2023)   Overall Financial Resource Strain (CARDIA)    Difficulty of Paying  Living Expenses: Not very hard  Food Insecurity: No Food Insecurity (08/15/2023)   Hunger Vital Sign    Worried About Running Out of Food in the Last Year: Never true    Ran Out of Food in the Last Year: Never true  Transportation Needs: No Transportation Needs (08/15/2023)   PRAPARE - Administrator, Civil Service (Medical): No    Lack of Transportation (Non-Medical): No  Physical Activity: Sufficiently Active (08/15/2023)   Exercise Vital Sign    Days of Exercise per Week: 2 days    Minutes of Exercise per Session: 120 min  Stress: No Stress Concern Present (08/15/2023)   [employer] of Occupational Health - Occupational Stress Questionnaire    Feeling of Stress : Not at all  Social Connections: Moderately Isolated (08/15/2023)   Social Connection and Isolation Panel [NHANES]    Frequency of Communication with Friends and Family: Twice a week    Frequency of Social Gatherings with Friends and Family: Twice a week    Attends Religious Services: 1 to 4 times per year    Active Member of [employer] or Organizations: No    Attends Engineer, structural: Not on file    Marital Status: Divorced  Catering manager Violence: Not on file   Social History   Tobacco Use  Smoking Status Never  Smokeless Tobacco Never   Social History   Substance and Sexual Activity  Alcohol Use Yes   Comment: on occasion    Family History:  Family History  Problem Relation Age of Onset   Cancer Mother    Heart Problems Mother    Cancer Father    Heart disease Father    Heart Problems Sister    Cancer Sister    Breast cancer Sister    Heart Problems Sister    Cancer Sister    Heart disease Sister    Thyroid cancer Sister     Past medical history, surgical history, medications, allergies, family history and social history reviewed with patient today and changes made to appropriate areas of the chart.   Review of Systems  Eyes:  Negative for blurred vision and double vision.   Respiratory:  Negative for shortness of breath.   Cardiovascular:  Negative for chest pain, palpitations and leg swelling.  Neurological:  Negative for dizziness and headaches.   All other ROS negative except what is listed above and in the HPI.      Objective:     BP (!) 114/56   Pulse 64  Wt 244 lb (110.7 kg)   BMI 34.03 kg/m   Wt Readings from Last 3 Encounters:  01/07/24 244 lb (110.7 kg)  12/04/23 251 lb 9.6 oz (114.1 kg)  08/16/23 247 lb (112 kg)    Physical Exam Vitals and nursing note reviewed.  Constitutional:      General: He is not in acute distress.    Appearance: Normal appearance. He is not ill-appearing, toxic-appearing or diaphoretic.  HENT:     Head: Normocephalic.     Right Ear: Tympanic membrane, ear canal and external ear normal.     Left Ear: Tympanic membrane, ear canal and external ear normal.     Nose: Nose normal. No congestion or rhinorrhea.     Mouth/Throat:     Mouth: Mucous membranes are moist.  Eyes:     General:        Right eye: No discharge.        Left eye: No discharge.     Extraocular Movements: Extraocular movements intact.     Conjunctiva/sclera: Conjunctivae normal.     Pupils: Pupils are equal, round, and reactive to light.  Cardiovascular:     Rate and Rhythm: Normal rate and regular rhythm.     Heart sounds: No murmur heard. Pulmonary:     Effort: Pulmonary effort is normal. No respiratory distress.     Breath sounds: Normal breath sounds. No wheezing, rhonchi or rales.  Abdominal:     General: Abdomen is flat. Bowel sounds are normal. There is no distension.     Palpations: Abdomen is soft.     Tenderness: There is no abdominal tenderness. There is no guarding.  Musculoskeletal:     Cervical back: Normal range of motion and neck supple.  Skin:    General: Skin is warm and dry.     Capillary Refill: Capillary refill takes less than 2 seconds.  Neurological:     General: No focal deficit present.     Mental Status: He  is alert and oriented to person, place, and time.     Cranial Nerves: No cranial nerve deficit.     Motor: No weakness.     Deep Tendon Reflexes: Reflexes normal.  Psychiatric:        Mood and Affect: Mood normal.        Behavior: Behavior normal.        Thought Content: Thought content normal.        Judgment: Judgment normal.     Results for orders placed or performed in visit on 12/04/23  Urine Culture   Collection Time: 12/04/23  8:40 AM   Specimen: Urine   UR  Result Value Ref Range   Urine Culture, Routine Final report    Organism ID, Bacteria No growth   Urinalysis, Routine w reflex microscopic   Collection Time: 12/04/23  8:40 AM  Result Value Ref Range   Specific Gravity, UA 1.020 1.005 - 1.030   pH, UA 6.5 5.0 - 7.5   Color, UA Yellow Yellow   Appearance Ur Clear Clear   Leukocytes,UA Negative Negative   Protein,UA Negative Negative/Trace   Glucose, UA Negative Negative   Ketones, UA Negative Negative   RBC, UA Negative Negative   Bilirubin, UA Negative Negative   Urobilinogen, Ur 1.0 0.2 - 1.0 mg/dL   Nitrite, UA Negative Negative   Microscopic Examination Comment   HgB A1c   Collection Time: 12/04/23  8:54 AM  Result Value Ref Range   Hgb A1c MFr  Bld 6.0 (H) 4.8 - 5.6 %   Est. average glucose Bld gHb Est-mCnc 126 mg/dL  Lipid Profile   Collection Time: 12/04/23  8:54 AM  Result Value Ref Range   Cholesterol, Total 173 100 - 199 mg/dL   Triglycerides 161 (H) 0 - 149 mg/dL   HDL 41 >09 mg/dL   VLDL Cholesterol Cal 38 5 - 40 mg/dL   LDL Chol Calc (NIH) 94 0 - 99 mg/dL   Chol/HDL Ratio 4.2 0.0 - 5.0 ratio  Comp Met (CMET)   Collection Time: 12/04/23  8:54 AM  Result Value Ref Range   Glucose 97 70 - 99 mg/dL   BUN 19 8 - 27 mg/dL   Creatinine, Ser 6.04 0.76 - 1.27 mg/dL   eGFR 63 >54 UJ/WJX/9.14   BUN/Creatinine Ratio 15 10 - 24   Sodium 140 134 - 144 mmol/L   Potassium 4.5 3.5 - 5.2 mmol/L   Chloride 103 96 - 106 mmol/L   CO2 22 20 - 29 mmol/L    Calcium  9.6 8.6 - 10.2 mg/dL   Total Protein 7.1 6.0 - 8.5 g/dL   Albumin 4.7 3.9 - 4.9 g/dL   Globulin, Total 2.4 1.5 - 4.5 g/dL   Bilirubin Total 0.4 0.0 - 1.2 mg/dL   Alkaline Phosphatase 70 44 - 121 IU/L   AST 27 0 - 40 IU/L   ALT 26 0 - 44 IU/L  PSA   Collection Time: 12/04/23  8:54 AM  Result Value Ref Range   Prostate Specific Ag, Serum 0.9 0.0 - 4.0 ng/mL      Assessment & Plan:   Problem List Items Addressed This Visit       Cardiovascular and Mediastinum   Primary hypertension   Chronic.  Controlled without medication.  Continue to check blood pressure at home and bring log to next visit. Labs ordered today.  Return to clinic in 6 months for reevaluation.  Call sooner if concerns arise.          Other   Hyperlipidemia   Chronic.  Controlled.  Continue with current medication regimen of Rosuvastatin  daily.  Labs ordered today.  Return to clinic in 6 months for reevaluation.  Call sooner if concerns arise.        Prediabetes   A1c of 6.0% at visit in May.  Has made lifestyle changes. Will recheck labs at next visit.       Other Visit Diagnoses       Annual physical exam    -  Primary   Health maintenance reviewed during visit today.  Labs ordered.  Vaccines reviewed.  Colonoscopy up to date.   Relevant Orders   TSH   PSA   Lipid panel   CBC with Differential/Platelet   Comprehensive metabolic panel with GFR     Screening for ischemic heart disease       Relevant Orders   Lipid panel        Discussed aspirin prophylaxis for myocardial infarction prevention and decision was it was not indicated  LABORATORY TESTING:  Health maintenance labs ordered today as discussed above.   The natural history of prostate cancer and ongoing controversy regarding screening and potential treatment outcomes of prostate cancer has been discussed with the patient. The meaning of a false positive PSA and a false negative PSA has been discussed. He indicates understanding of  the limitations of this screening test and wishes to proceed with screening PSA testing.   IMMUNIZATIONS:   -  Tdap: Tetanus vaccination status reviewed: Medicare. - Influenza: Postponed to flu season - Pneumovax: Refused - Prevnar: Refused - COVID: Not applicable - HPV: Not applicable - Shingrix vaccine: Up to date  SCREENING: - Colonoscopy: Up to date  Discussed with patient purpose of the colonoscopy is to detect colon cancer at curable precancerous or early stages   - AAA Screening: Not applicable  -Hearing Test: Not applicable  -Spirometry: Not applicable   PATIENT COUNSELING:    Sexuality: Discussed sexually transmitted diseases, partner selection, use of condoms, avoidance of unintended pregnancy  and contraceptive alternatives.   Advised to avoid cigarette smoking.  I discussed with the patient that most people either abstain from alcohol or drink within safe limits (<=14/week and <=4 drinks/occasion for males, <=7/weeks and <= 3 drinks/occasion for females) and that the risk for alcohol disorders and other health effects rises proportionally with the number of drinks per week and how often a drinker exceeds daily limits.  Discussed cessation/primary prevention of drug use and availability of treatment for abuse.   Diet: Encouraged to adjust caloric intake to maintain  or achieve ideal body weight, to reduce intake of dietary saturated fat and total fat, to limit sodium intake by avoiding high sodium foods and not adding table salt, and to maintain adequate dietary potassium and calcium  preferably from fresh fruits, vegetables, and low-fat dairy products.    stressed the importance of regular exercise  Injury prevention: Discussed safety belts, safety helmets, smoke detector, smoking near bedding or upholstery.   Dental health: Discussed importance of regular tooth brushing, flossing, and dental visits.   Follow up plan: NEXT PREVENTATIVE PHYSICAL DUE IN 1 YEAR. Return  in about 3 months (around 04/08/2024) for Welcome to Medicare- 40 minute appt.

## 2024-01-07 NOTE — Assessment & Plan Note (Signed)
 Chronic.  Controlled without medication.  Continue to check blood pressure at home and bring log to next visit. Labs ordered today.  Return to clinic in 6 months for reevaluation.  Call sooner if concerns arise.

## 2024-01-08 ENCOUNTER — Ambulatory Visit: Payer: Self-pay | Admitting: Nurse Practitioner

## 2024-01-08 LAB — COMPREHENSIVE METABOLIC PANEL WITH GFR
ALT: 29 IU/L (ref 0–44)
AST: 23 IU/L (ref 0–40)
Albumin: 4.3 g/dL (ref 3.9–4.9)
Alkaline Phosphatase: 69 IU/L (ref 44–121)
BUN/Creatinine Ratio: 14 (ref 10–24)
BUN: 15 mg/dL (ref 8–27)
Bilirubin Total: 0.4 mg/dL (ref 0.0–1.2)
CO2: 20 mmol/L (ref 20–29)
Calcium: 9.5 mg/dL (ref 8.6–10.2)
Chloride: 103 mmol/L (ref 96–106)
Creatinine, Ser: 1.06 mg/dL (ref 0.76–1.27)
Globulin, Total: 2.7 g/dL (ref 1.5–4.5)
Glucose: 92 mg/dL (ref 70–99)
Potassium: 4.5 mmol/L (ref 3.5–5.2)
Sodium: 138 mmol/L (ref 134–144)
Total Protein: 7 g/dL (ref 6.0–8.5)
eGFR: 78 mL/min/{1.73_m2} (ref >59)

## 2024-01-08 LAB — CBC WITH DIFFERENTIAL/PLATELET
Basophils Absolute: 0 10*3/uL (ref 0.0–0.2)
Basos: 0 %
EOS (ABSOLUTE): 0.1 10*3/uL (ref 0.0–0.4)
Eos: 2 %
Hematocrit: 42.8 % (ref 37.5–51.0)
Hemoglobin: 13.5 g/dL (ref 13.0–17.7)
Immature Grans (Abs): 0 10*3/uL (ref 0.0–0.1)
Immature Granulocytes: 0 %
Lymphocytes Absolute: 2.4 10*3/uL (ref 0.7–3.1)
Lymphs: 38 %
MCH: 29.6 pg (ref 26.6–33.0)
MCHC: 31.5 g/dL (ref 31.5–35.7)
MCV: 94 fL (ref 79–97)
Monocytes Absolute: 0.6 10*3/uL (ref 0.1–0.9)
Monocytes: 10 %
Neutrophils Absolute: 3.1 10*3/uL (ref 1.4–7.0)
Neutrophils: 50 %
Platelets: 245 10*3/uL (ref 150–450)
RBC: 4.56 x10E6/uL (ref 4.14–5.80)
RDW: 12.8 % (ref 11.6–15.4)
WBC: 6.2 10*3/uL (ref 3.4–10.8)

## 2024-01-08 LAB — LIPID PANEL
Chol/HDL Ratio: 3.7 ratio (ref 0.0–5.0)
Cholesterol, Total: 153 mg/dL (ref 100–199)
HDL: 41 mg/dL (ref >39)
LDL Chol Calc (NIH): 84 mg/dL (ref 0–99)
Triglycerides: 164 mg/dL — ABNORMAL HIGH (ref 0–149)
VLDL Cholesterol Cal: 28 mg/dL (ref 5–40)

## 2024-01-08 LAB — TSH: TSH: 1.77 u[IU]/mL (ref 0.450–4.500)

## 2024-01-08 LAB — PSA: Prostate Specific Ag, Serum: 0.9 ng/mL (ref 0.0–4.0)

## 2024-03-19 ENCOUNTER — Ambulatory Visit: Payer: Self-pay

## 2024-03-19 ENCOUNTER — Ambulatory Visit (INDEPENDENT_AMBULATORY_CARE_PROVIDER_SITE_OTHER)

## 2024-03-19 ENCOUNTER — Ambulatory Visit
Admission: RE | Admit: 2024-03-19 | Discharge: 2024-03-19 | Disposition: A | Discharge status: Home / Self Care | Source: Ambulatory Visit | Attending: Physician Assistant | Admitting: Physician Assistant

## 2024-03-19 VITALS — BP 147/84 | HR 80 | Temp 99.1°F | Resp 19 | Wt 232.0 lb

## 2024-03-19 DIAGNOSIS — R051 Acute cough: Secondary | ICD-10-CM | POA: Diagnosis not present

## 2024-03-19 DIAGNOSIS — R062 Wheezing: Secondary | ICD-10-CM

## 2024-03-19 DIAGNOSIS — J189 Pneumonia, unspecified organism: Secondary | ICD-10-CM | POA: Diagnosis not present

## 2024-03-19 DIAGNOSIS — R0602 Shortness of breath: Secondary | ICD-10-CM | POA: Diagnosis not present

## 2024-03-19 MED ORDER — DOXYCYCLINE HYCLATE 100 MG PO CAPS: 100.0000 mg | ORAL_CAPSULE | Freq: Two times a day (BID) | ORAL | 0 refills | Status: AC

## 2024-03-19 MED ORDER — PREDNISONE 20 MG PO TABS: 40.0000 mg | ORAL_TABLET | Freq: Every day | ORAL | 0 refills | Status: AC

## 2024-03-19 MED ORDER — PROMETHAZINE-DM 6.25-15 MG/5ML PO SYRP: 5.0000 mL | ORAL_SOLUTION | Freq: Four times a day (QID) | ORAL | 0 refills | Status: DC | PRN

## 2024-03-19 NOTE — ED Triage Notes (Addendum)
 Patient states that he's had a productive cough x 2 weeks. Patient states that he's been having fatigue,diarrhea for 10 days. Patient states that he done an at home covid test that was negative.

## 2024-03-19 NOTE — Telephone Encounter (Signed)
 FYI Only or Action Required?: FYI only for provider.  Patient was last seen in primary care on 01/07/2024 by Melvin Pao, NP.  Called Nurse Triage reporting Cough.  Symptoms began several weeks ago.  Interventions attempted: Delsym and mucinex .  Symptoms are: gradually worsening.  Triage Disposition: See Physician Within 24 Hours  Patient/caregiver understands and will follow disposition?: Yes     Copied from CRM #[id number]. Topic: Clinical - Red Word Triage >> Mar 19, 2024 10:14 AM [place] MATSU wrote: Red Word that prompted transfer to Nurse Triage: weakeness, congestion, cough,a little bit of shortness of breathe diarrhea for 2 weeks.   Covid negative Reason for Disposition  [1] Continuous (nonstop) coughing interferes with work or school AND [2] no improvement using cough treatment per Care Advice  Answer Assessment - Initial Assessment Questions At home covid test negative. Has to sit up to relieve cough. Symptoms are worse in the morning when walking up. Taking Delsym and mucinex for symptoms. Pt states symptoms are worsening with diarrhea. Appointment made for patient at [facility] at [facility] due no appointments available with PCP.     1. ONSET: When did the cough begin?      2 weeks ago  2. SEVERITY: How bad is the cough today?      Pt states cough is not too bad today  3. SPUTUM: Describe the color of your sputum (e.g., none, dry cough; clear, white, yellow, green)     Greenish- yellow  4. HEMOPTYSIS: Are you coughing up any blood? If Yes, ask: How much? (e.g., flecks, streaks, tablespoons, etc.)     No 5. DIFFICULTY BREATHING: Are you having difficulty breathing? If Yes, ask: How bad is it? (e.g., mild, moderate, severe)      Mild  6. FEVER: Do you have a fever? If Yes, ask: What is your temperature, how was it measured, and when did it start?     Did not take temperature  7. CARDIAC HISTORY: Do you have any history of  heart disease? (e.g., heart attack, congestive heart failure)      Some mild blood pressure issues in the past  8. LUNG HISTORY: Do you have any history of lung disease?  (e.g., pulmonary embolus, asthma, emphysema)     No 9. PE RISK FACTORS: Do you have a history of blood clots? (or: recent major surgery, recent prolonged travel, bedridden)     No 10. OTHER SYMPTOMS: Do you have any other symptoms? (e.g., runny nose, wheezing, chest pain)       Slight wheezing, congestion, shortness of breath, weakness  Protocols used: Cough - Acute Productive-A-AH

## 2024-03-19 NOTE — Discharge Instructions (Addendum)
-   You have a small pneumonia. - I sent cough medication, antibiotics and corticosteroids. - He should be feeling better in the next few days but cough can linger for a while longer.  You should repeat this x-ray in 3 to 4 weeks to make sure everything is cleared up.  You may follow-up with PCP or return to our department to have this done. - If you develop a fever or feel worse you should return sooner.

## 2024-03-19 NOTE — ED Provider Notes (Signed)
 [facility]    CSN: [id number] Arrival date & time: 03/19/24  1555      History   Chief Complaint Chief Complaint  Patient presents with   Cough    Entered by patient   Diarrhea   Fatigue    HPI Jason Frye is a 68 y.o. male presenting for 2-week history of productive cough, fatigue, congestion, intermittent diarrhea.  Denies fever, sinus pain, ear pain, sore throat, chest pain.  Reports a little shortness of breath at times and wheezing.  Has been taking Delsym and Mucinex.  Patient says he has not been improving.  He has taken a few COVID tests at home which have been negative.  He has no history of asthma or COPD and is a non-smoker.  HPI  Past Medical History:  Diagnosis Date   Allergy    Arthritis    Glaucoma    Hyperlipidemia    Hypertension     Patient Active Problem List   Diagnosis Date Noted   Prediabetes 01/07/2024   Primary hypertension 05/11/2021   Cardiac murmur 01/04/2021   Hyperlipidemia 01/04/2021   DISH (diffuse idiopathic skeletal hyperostosis) 01/04/2021   Other fatigue 01/04/2021    Past Surgical History:  Procedure Laterality Date   COLONOSCOPY WITH PROPOFOL  N/A 06/16/2021   Procedure: COLONOSCOPY WITH PROPOFOL ;  Surgeon: Janalyn Keene NOVAK, MD;  Location: [facility];  Service: Endoscopy;  Laterality: N/A;   TONSILLECTOMY         Home Medications    Prior to Admission medications   Medication Sig Start Date End Date Taking? Authorizing Provider  doxycycline  (VIBRAMYCIN ) 100 MG capsule Take 1 capsule (100 mg total) by mouth 2 (two) times daily for 7 days. 03/19/24 03/26/24 Yes Arvis Huxley B, PA-C  predniSONE  (DELTASONE ) 20 MG tablet Take 2 tablets (40 mg total) by mouth daily for 5 days. 03/19/24 03/24/24 Yes Arvis Huxley B, PA-C  promethazine -dextromethorphan (PROMETHAZINE -DM) 6.25-15 MG/5ML syrup Take 5 mLs by mouth 4 (four) times daily as needed. 03/19/24  Yes Arvis Huxley B, PA-C  rosuvastatin  (CRESTOR ) 10 MG  tablet Take 1 tablet (10 mg total) by mouth daily. 12/04/23  Yes Herold Hadassah SQUIBB, MD  B Complex Vitamins (VITAMIN B-COMPLEX) TABS Take 1 tablet by mouth daily.    [provider]  meloxicam  (MOBIC ) 15 MG tablet Take 1 tablet by mouth daily. 01/26/22   [provider]  Multiple Vitamin (MULTI-VITAMINS) TABS Take by mouth.    [provider]    Family History Family History  Problem Relation Age of Onset   Cancer Mother    Heart Problems Mother    Cancer Father    Heart disease Father    Heart Problems Sister    Cancer Sister    Breast cancer Sister    Heart Problems Sister    Cancer Sister    Heart disease Sister    Thyroid cancer Sister     Social History Social History   Tobacco Use   Smoking status: Never   Smokeless tobacco: Never  Vaping Use   Vaping status: Never Used  Substance Use Topics   Alcohol use: Yes    Comment: on occasion   Drug use: Never     Allergies   Penicillins   Review of Systems Review of Systems  Constitutional:  Positive for fatigue. Negative for fever.  HENT:  Positive for congestion and rhinorrhea. Negative for sinus pressure, sinus pain and sore throat.   Respiratory:  Positive for cough,  shortness of breath and wheezing.   Cardiovascular:  Negative for chest pain.  Gastrointestinal:  Positive for diarrhea. Negative for abdominal pain, nausea and vomiting.  Musculoskeletal:  Negative for myalgias.  Neurological:  Negative for weakness, light-headedness and headaches.  Hematological:  Negative for adenopathy.     Physical Exam Triage Vital Signs ED Triage Vitals  Encounter Vitals Group     BP      Girls Systolic BP Percentile      Girls Diastolic BP Percentile      Boys Systolic BP Percentile      Boys Diastolic BP Percentile      Pulse      Resp      Temp      Temp src      SpO2      Weight      Height      Head Circumference      Peak Flow      Pain Score      Pain Loc      Pain Education       Exclude from Growth Chart    No data found.  Updated Vital Signs BP (!) 147/84 (BP Location: Right Arm)   Pulse 80   Temp 99.1 F (37.3 C) (Oral)   Resp 19   Wt 232 lb (105.2 kg)   SpO2 94%   BMI 32.36 kg/m      Physical Exam Vitals and nursing note reviewed.  Constitutional:      General: He is not in acute distress.    Appearance: Normal appearance. He is well-developed. He is not ill-appearing.  HENT:     Head: Normocephalic and atraumatic.     Right Ear: Tympanic membrane, ear canal and external ear normal.     Left Ear: Tympanic membrane, ear canal and external ear normal.     Nose: Congestion present.     Mouth/Throat:     Mouth: Mucous membranes are moist.     Pharynx: Oropharynx is clear.  Eyes:     General: No scleral icterus.    Conjunctiva/sclera: Conjunctivae normal.  Cardiovascular:     Rate and Rhythm: Normal rate and regular rhythm.  Pulmonary:     Effort: Pulmonary effort is normal. No respiratory distress.     Breath sounds: Wheezing and rhonchi present.  Musculoskeletal:     Cervical back: Neck supple.  Skin:    General: Skin is warm and dry.     Capillary Refill: Capillary refill takes less than 2 seconds.  Neurological:     General: No focal deficit present.     Mental Status: He is alert. Mental status is at baseline.     Motor: No weakness.     Gait: Gait normal.  Psychiatric:        Mood and Affect: Mood normal.      UC Treatments / Results  Labs (all labs ordered are listed, but only abnormal results are displayed) Labs Reviewed - No data to display  EKG   Radiology DG Chest 2 View Result Date: 03/19/2024 EXAM: 2 VIEW(S) XRAY OF THE CHEST 03/19/2024 04:36:34 PM COMPARISON: None available. CLINICAL HISTORY: Cough and congestion x 2 weeks. FINDINGS: LUNGS AND PLEURA: Suspect patchy inferior lingular opacity. Right lung clear. No pulmonary edema. No pleural effusion. No pneumothorax. HEART AND MEDIASTINUM: No acute abnormality  of the cardiac and mediastinal silhouettes. BONES AND SOFT TISSUES: Vertebral endplate spurring at multiple levels in the lower thoracic spine. No acute  osseous abnormality. IMPRESSION: 1. Suspect patchy inferior lingular opacity. 2. Right lung clear. Electronically signed by: Dayne Hassell MD 03/19/2024 04:41 PM EDT RP Workstation: [id number]    Procedures Procedures (including critical care time)  Medications Ordered in UC Medications - No data to display  Initial Impression / Assessment and Plan / UC Course  I have reviewed the triage vital signs and the nursing notes.  Pertinent labs & imaging results that were available during my care of the patient were reviewed by me and considered in my medical decision making (see chart for details).   68 year old male presents for 2-week history of productive cough, and fatigue, congestion and shortness of breath.  On evaluation he has nasal congestion and a few scattered wheezes and rhonchi throughout.  Chest x-ray performed.  Shows concern for lingular pneumonia.  Reviewed this with patient.  Patient has history of allergy to penicillin and says he avoids anything remotely related to penicillin.  Will treat for community-acquired pneumonia with doxycycline .  Also sent prednisone  and Promethazine  DM.  Encourage increasing fluids and rest.  Advised to return if he cannot see PCP in 3 to 4 weeks to have a repeat x-ray to ensure resolution of pneumonia.  Reviewed returning sooner for any fever or worsening symptoms or if he is not improving.  Acute illness with systemic symptoms.   Final Clinical Impressions(s) / UC Diagnoses   Final diagnoses:  Acute cough  Lingular pneumonia  Wheezing     Discharge Instructions      - You have a small pneumonia. - I sent cough medication, antibiotics and corticosteroids. - He should be feeling better in the next few days but cough can linger for a while longer.  You should repeat this x-ray in 3 to 4  weeks to make sure everything is cleared up.  You may follow-up with PCP or return to our department to have this done. - If you develop a fever or feel worse you should return sooner.     ED Prescriptions     Medication Sig Dispense Auth. Provider   doxycycline  (VIBRAMYCIN ) 100 MG capsule Take 1 capsule (100 mg total) by mouth 2 (two) times daily for 7 days. 14 capsule Arvis Huxley B, PA-C   predniSONE  (DELTASONE ) 20 MG tablet Take 2 tablets (40 mg total) by mouth daily for 5 days. 10 tablet Arvis Huxley B, PA-C   promethazine -dextromethorphan (PROMETHAZINE -DM) 6.25-15 MG/5ML syrup Take 5 mLs by mouth 4 (four) times daily as needed. 118 mL Arvis Huxley NOVAK, PA-C      PDMP not reviewed this encounter.   Arvis Huxley NOVAK, PA-C 03/19/24 1704

## 2024-04-06 ENCOUNTER — Ambulatory Visit (HOSPITAL_COMMUNITY): Payer: Self-pay

## 2024-04-06 ENCOUNTER — Emergency Department

## 2024-04-06 ENCOUNTER — Ambulatory Visit
Admission: RE | Admit: 2024-04-06 | Discharge: 2024-04-06 | Disposition: A | Discharge status: Other Acute Inpatient Hospital | Source: Ambulatory Visit

## 2024-04-06 ENCOUNTER — Ambulatory Visit

## 2024-04-06 ENCOUNTER — Other Ambulatory Visit: Payer: Self-pay

## 2024-04-06 ENCOUNTER — Observation Stay
Admission: EM | Admit: 2024-04-06 | Discharge: 2024-04-08 | Disposition: A | Discharge status: Home / Self Care | Attending: Internal Medicine | Admitting: Internal Medicine

## 2024-04-06 VITALS — BP 130/78 | HR 76 | Temp 99.0°F | Resp 15 | Wt 236.0 lb

## 2024-04-06 DIAGNOSIS — I2510 Atherosclerotic heart disease of native coronary artery without angina pectoris: Secondary | ICD-10-CM | POA: Insufficient documentation

## 2024-04-06 DIAGNOSIS — U071 COVID-19: Secondary | ICD-10-CM | POA: Insufficient documentation

## 2024-04-06 DIAGNOSIS — R42 Dizziness and giddiness: Secondary | ICD-10-CM | POA: Diagnosis present

## 2024-04-06 DIAGNOSIS — R051 Acute cough: Secondary | ICD-10-CM | POA: Insufficient documentation

## 2024-04-06 DIAGNOSIS — Z79899 Other long term (current) drug therapy: Secondary | ICD-10-CM | POA: Insufficient documentation

## 2024-04-06 DIAGNOSIS — Z8701 Personal history of pneumonia (recurrent): Secondary | ICD-10-CM | POA: Insufficient documentation

## 2024-04-06 DIAGNOSIS — R7303 Prediabetes: Secondary | ICD-10-CM | POA: Diagnosis not present

## 2024-04-06 DIAGNOSIS — R55 Syncope and collapse: Principal | ICD-10-CM | POA: Diagnosis present

## 2024-04-06 DIAGNOSIS — I2584 Coronary atherosclerosis due to calcified coronary lesion: Secondary | ICD-10-CM | POA: Insufficient documentation

## 2024-04-06 DIAGNOSIS — Z7982 Long term (current) use of aspirin: Secondary | ICD-10-CM | POA: Diagnosis not present

## 2024-04-06 DIAGNOSIS — R569 Unspecified convulsions: Secondary | ICD-10-CM | POA: Insufficient documentation

## 2024-04-06 DIAGNOSIS — F1090 Alcohol use, unspecified, uncomplicated: Secondary | ICD-10-CM | POA: Insufficient documentation

## 2024-04-06 LAB — CBC WITH DIFFERENTIAL/PLATELET
Abs Immature Granulocytes: 0.03 K/uL (ref 0.00–0.07)
Basophils Absolute: 0 K/uL (ref 0.0–0.1)
Basophils Relative: 0 %
Eosinophils Absolute: 0.4 K/uL (ref 0.0–0.5)
Eosinophils Relative: 5 %
HCT: 41 % (ref 39.0–52.0)
Hemoglobin: 13.3 g/dL (ref 13.0–17.0)
Immature Granulocytes: 0 %
Lymphocytes Relative: 27 %
Lymphs Abs: 2 K/uL (ref 0.7–4.0)
MCH: 29.7 pg (ref 26.0–34.0)
MCHC: 32.4 g/dL (ref 30.0–36.0)
MCV: 91.5 fL (ref 80.0–100.0)
Monocytes Absolute: 1 K/uL (ref 0.1–1.0)
Monocytes Relative: 14 %
Neutro Abs: 3.7 K/uL (ref 1.7–7.7)
Neutrophils Relative %: 54 %
Platelets: 218 K/uL (ref 150–400)
RBC: 4.48 MIL/uL (ref 4.22–5.81)
RDW: 13.2 % (ref 11.5–15.5)
WBC: 7.1 K/uL (ref 4.0–10.5)
nRBC: 0 % (ref 0.0–0.2)

## 2024-04-06 LAB — BASIC METABOLIC PANEL WITH GFR
Anion gap: 10 (ref 5–15)
BUN: 19 mg/dL (ref 8–23)
CO2: 25 mmol/L (ref 22–32)
Calcium: 8.9 mg/dL (ref 8.9–10.3)
Chloride: 104 mmol/L (ref 98–111)
Creatinine, Ser: 1.08 mg/dL (ref 0.61–1.24)
GFR, Estimated: >60 mL/min (ref >60)
Glucose, Bld: 109 mg/dL — ABNORMAL HIGH (ref 70–99)
Potassium: 4.2 mmol/L (ref 3.5–5.1)
Sodium: 139 mmol/L (ref 135–145)

## 2024-04-06 LAB — MAGNESIUM
Magnesium: 2.2 mg/dL (ref 1.7–2.4)
Magnesium: 2.1 mg/dL (ref 1.7–2.4)

## 2024-04-06 LAB — GLUCOSE, CAPILLARY: Glucose-Capillary: 99 mg/dL (ref 70–99)

## 2024-04-06 LAB — BRAIN NATRIURETIC PEPTIDE: B Natriuretic Peptide: 54.7 pg/mL (ref 0.0–100.0)

## 2024-04-06 LAB — TROPONIN I (HIGH SENSITIVITY)
Troponin I (High Sensitivity): 6 ng/L (ref <18)
Troponin I (High Sensitivity): 12 ng/L (ref <18)

## 2024-04-06 LAB — RESP PANEL BY RT-PCR (FLU A&B, COVID) ARPGX2
Influenza A by PCR: NEGATIVE
Influenza B by PCR: NEGATIVE
SARS Coronavirus 2 by RT PCR: POSITIVE — AB

## 2024-04-06 MED ORDER — ENOXAPARIN SODIUM 60 MG/0.6ML IJ SOSY
0.5000 mg/kg | PREFILLED_SYRINGE | INTRAMUSCULAR | Status: DC
  Administered 2024-04-07 – 2024-04-08 (×2): 52.5 mg via SUBCUTANEOUS
  Filled 2024-04-06 (×2): qty 0.6

## 2024-04-06 MED ORDER — IOHEXOL 350 MG/ML SOLN
75.0000 mL | Freq: Once | INTRAVENOUS | Status: AC | PRN
  Administered 2024-04-06: 75 mL via INTRAVENOUS

## 2024-04-06 MED ORDER — ROSUVASTATIN CALCIUM 10 MG PO TABS
10.0000 mg | ORAL_TABLET | Freq: Every day | ORAL | Status: DC
  Administered 2024-04-07 – 2024-04-08 (×2): 10 mg via ORAL
  Filled 2024-04-06 (×2): qty 1

## 2024-04-06 MED ORDER — LACTATED RINGERS IV SOLN: INTRAVENOUS | Status: AC

## 2024-04-06 MED ORDER — ONDANSETRON HCL 4 MG/2ML IJ SOLN: 4.0000 mg | Freq: Four times a day (QID) | INTRAMUSCULAR | Status: DC | PRN

## 2024-04-06 MED ORDER — SODIUM CHLORIDE 0.9 % IV BOLUS
1000.0000 mL | Freq: Once | INTRAVENOUS | Status: AC
  Administered 2024-04-06: 1000 mL via INTRAVENOUS

## 2024-04-06 MED ORDER — ONDANSETRON HCL 4 MG PO TABS: 4.0000 mg | ORAL_TABLET | Freq: Four times a day (QID) | ORAL | Status: DC | PRN

## 2024-04-06 MED ORDER — ACETAMINOPHEN 650 MG RE SUPP: 650.0000 mg | Freq: Four times a day (QID) | RECTAL | Status: DC | PRN

## 2024-04-06 MED ORDER — ASPIRIN 81 MG PO TBEC
81.0000 mg | DELAYED_RELEASE_TABLET | Freq: Every day | ORAL | Status: DC
  Administered 2024-04-07 – 2024-04-08 (×2): 81 mg via ORAL
  Filled 2024-04-06 (×2): qty 1

## 2024-04-06 MED ORDER — ACETAMINOPHEN 325 MG PO TABS
650.0000 mg | ORAL_TABLET | Freq: Four times a day (QID) | ORAL | Status: DC | PRN
  Administered 2024-04-06 – 2024-04-07 (×2): 650 mg via ORAL
  Filled 2024-04-06 (×3): qty 2

## 2024-04-06 NOTE — ED Triage Notes (Signed)
 Pt was at his PCP office and has a sycope episode. EMS sts that pt wife sts that pt started to have shaking while he was passing out however pt did bite his tongue. Pt does not have a hx of seizures. EMS sts that the pt PA is not convinced the pt had a seizure as pt appeared to pass out.

## 2024-04-06 NOTE — H&P (Signed)
 History and Physical    Patient: Jason Frye FMW:[id number] DOB: [birth year] DOA: 04/06/2024 DOS: the patient was seen and examined on 04/06/2024 PCP: Melvin Pao, NP  Patient coming from: Urgent care    Chief Complaint:  Chief Complaint  Patient presents with   Loss of Consciousness   HPI: Jason Frye is a 68 y.o. male with medical history significant  for HLD, presented to the emergency department for evaluation of syncope that occurred at urgent care earlier today. Patient was being evaluated for sore throat and nonproductive cough that started on Friday.  While sitting in his appointment answering questions, he synopsized on 2 separate occasions.  Both times were precipitated by a sensation of dizziness. No seizure activity, bladder or bowel incontinence, or tongue biting noted.  He denies fevers, chills, nausea, vomiting, diarrhea, abdominal pain, weakness, numbness or paresthesias.  He tested positive for COVID-19.  In the ED, he was hemodynamically stable saturating appropriately on room air. CTA negative for PE and without evidence of pneumonia. Coronary atherosclerosis was incidentally noted.  He denies history of palpitations, chest pain, shortness of breath with activity.  He is very active and plays basketball weekly with friends. CT head and C-spine unremarkable.   Admitted to observation for syncope in the setting of COVID 19.   Review of Systems: Review of Systems  Constitutional:  Positive for malaise/fatigue. Negative for chills, diaphoresis, fever and weight loss.  HENT:  Positive for congestion and sore throat. Negative for hearing loss and tinnitus.   Eyes:  Positive for pain. Negative for blurred vision.  Respiratory:  Positive for cough. Negative for hemoptysis, sputum production, shortness of breath and wheezing.   Cardiovascular:  Negative for chest pain, palpitations, orthopnea, leg swelling and PND.  Gastrointestinal:  Negative for abdominal pain,  constipation, diarrhea, nausea and vomiting.  Genitourinary:  Negative for dysuria and frequency.  Musculoskeletal:  Positive for neck pain. Negative for back pain, falls, joint pain and myalgias.  Skin:  Negative for itching and rash.  Neurological:  Positive for loss of consciousness. Negative for dizziness, tingling, tremors, sensory change, speech change, focal weakness and headaches.  Psychiatric/Behavioral:  Negative for depression, substance abuse and suicidal ideas.     Past Medical History:  Diagnosis Date   Allergy    Arthritis    Glaucoma    Hyperlipidemia    Hypertension    Past Surgical History:  Procedure Laterality Date   COLONOSCOPY WITH PROPOFOL  N/A 06/16/2021   Procedure: COLONOSCOPY WITH PROPOFOL ;  Surgeon: Janalyn Keene NOVAK, MD;  Location: [facility] ENDOSCOPY;  Service: Endoscopy;  Laterality: N/A;   TONSILLECTOMY     Social History:  reports that he has never smoked. He has never used smokeless tobacco. He reports current alcohol use. He reports that he does not use drugs.  Allergies  Allergen Reactions   Penicillins     Family History  Problem Relation Age of Onset   Cancer Mother    Heart Problems Mother    Cancer Father    Heart disease Father    Heart Problems Sister    Cancer Sister    Breast cancer Sister    Heart Problems Sister    Cancer Sister    Heart disease Sister    Thyroid cancer Sister     Prior to Admission medications   Medication Sig Start Date End Date Taking? Authorizing Provider  B Complex Vitamins (VITAMIN B-COMPLEX) TABS Take 1 tablet by mouth daily.    [provider]  meloxicam  (MOBIC ) 15 MG tablet Take 1 tablet by mouth daily. 01/26/22   [provider]  Multiple Vitamin (MULTI-VITAMINS) TABS Take by mouth.    [provider]  promethazine -dextromethorphan (PROMETHAZINE -DM) 6.25-15 MG/5ML syrup Take 5 mLs by mouth 4 (four) times daily as needed. 03/19/24   Arvis Jolan NOVAK, PA-C  rosuvastatin   (CRESTOR ) 10 MG tablet Take 1 tablet (10 mg total) by mouth daily. 12/04/23   Herold Hadassah SQUIBB, MD    Physical Exam: Vitals:   04/06/24 1211 04/06/24 1212  BP:  (!) 153/73  Pulse:  69  Resp:  18  Temp:  97.9 F (36.6 C)  TempSrc:  Oral  SpO2:  100%  Weight: 106.6 kg   Height: 5' 11 (1.803 m)   Physical Exam Vitals and nursing note reviewed.  Constitutional:      General: He is not in acute distress.    Appearance: Normal appearance. He is not ill-appearing, toxic-appearing or diaphoretic.  HENT:     Head: Normocephalic.     Comments: Superficial laceration on top of head with dried blood    Mouth/Throat:     Mouth: Mucous membranes are moist.  Eyes:     General: No scleral icterus.    Extraocular Movements: Extraocular movements intact.     Pupils: Pupils are equal, round, and reactive to light.  Cardiovascular:     Rate and Rhythm: Normal rate and regular rhythm.  Pulmonary:     Effort: Pulmonary effort is normal.     Breath sounds: Normal breath sounds.  Abdominal:     General: There is no distension.     Palpations: Abdomen is soft.     Tenderness: There is no abdominal tenderness. There is no guarding.  Musculoskeletal:     Cervical back: Normal range of motion and neck supple. Tenderness present. No rigidity.     Right lower leg: No edema.     Left lower leg: No edema.  Skin:    General: Skin is warm and dry.  Neurological:     General: No focal deficit present.     Mental Status: He is alert and oriented to person, place, and time. Mental status is at baseline.     Cranial Nerves: No cranial nerve deficit.     Motor: No weakness.     Gait: Gait normal.  Psychiatric:        Mood and Affect: Mood normal.        Behavior: Behavior normal.     Data Reviewed:   Labs on Admission: I have personally reviewed following labs and imaging studies  CBC: Recent Labs  Lab 04/06/24 1211  WBC 7.1  NEUTROABS 3.7  HGB 13.3  HCT 41.0  MCV 91.5  PLT 218   Basic  Metabolic Panel: Recent Labs  Lab 04/06/24 1211  NA 139  K 4.2  CL 104  CO2 25  GLUCOSE 109*  BUN 19  CREATININE 1.08  CALCIUM  8.9  MG 2.2   GFR: Estimated Creatinine Clearance: 84.7 mL/min (by C-G formula based on SCr of 1.08 mg/dL). Liver Function Tests: No results for input(s): AST, ALT, ALKPHOS, BILITOT, PROT, ALBUMIN in the last 168 hours. No results for input(s): LIPASE, AMYLASE in the last 168 hours. No results for input(s): AMMONIA in the last 168 hours. Coagulation Profile: No results for input(s): INR, PROTIME in the last 168 hours. Cardiac Enzymes: No results for input(s): CKTOTAL, CKMB, CKMBINDEX, TROPONINI in the last 168 hours. BNP (last 3 results) No  results for input(s): PROBNP in the last 8760 hours. HbA1C: No results for input(s): HGBA1C in the last 72 hours. CBG: Recent Labs  Lab 04/06/24 1114  GLUCAP 99   Lipid Profile: No results for input(s): CHOL, HDL, LDLCALC, TRIG, CHOLHDL, LDLDIRECT in the last 72 hours. Thyroid Function Tests: No results for input(s): TSH, T4TOTAL, FREET4, T3FREE, THYROIDAB in the last 72 hours. Anemia Panel: No results for input(s): VITAMINB12, FOLATE, FERRITIN, TIBC, IRON, RETICCTPCT in the last 72 hours. Urine analysis:    Component Value Date/Time   APPEARANCEUR Clear 12/04/2023 0840   GLUCOSEU Negative 12/04/2023 0840   BILIRUBINUR Negative 12/04/2023 0840   PROTEINUR Negative 12/04/2023 0840   NITRITE Negative 12/04/2023 0840   LEUKOCYTESUR Negative 12/04/2023 0840    Radiological Exams on Admission: CT Head Wo Contrast Result Date: 04/06/2024 CLINICAL DATA:  trauma.; Syncope, fall. EXAM: CT HEAD WITHOUT CONTRAST CT CERVICAL SPINE WITHOUT CONTRAST TECHNIQUE: Multidetector CT imaging of the head and cervical spine was performed following the standard protocol without intravenous contrast. Multiplanar CT image reconstructions of the cervical spine  were also generated. RADIATION DOSE REDUCTION: This exam was performed according to the departmental dose-optimization program which includes automated exposure control, adjustment of the mA and/or kV according to patient size and/or use of iterative reconstruction technique. COMPARISON:  None Available. FINDINGS: CT HEAD FINDINGS Brain: No evidence of acute infarction, hemorrhage, hydrocephalus, extra-axial collection or mass lesion/mass effect. There is bilateral periventricular hypodensity, which is non-specific but most likely seen in the settings of microvascular ischemic changes. Mild in extent. Otherwise normal appearance of brain parenchyma. Ventricles are normal. Cerebral volume is age appropriate. Vascular: No hyperdense vessel or unexpected calcification. Intracranial arteriosclerosis. Skull: Normal. Negative for fracture or focal lesion. Sinuses/Orbits: Small air-fluid level noted in the both chambers of sphenoid sinus, which can be seen with sphenoid sinusitis. Correlate clinically. There is mild-to-moderate mucosal thickening in the ethmoidal air cells. Other: Visualized mastoid air cells are unremarkable. No mastoid effusion. CT CERVICAL SPINE FINDINGS Alignment: There is reversal of cervical lordosis, which can be positional or due to muscle spasm. Please note, this examination does not assess for ligamentous injury or stability. Skull base and vertebrae: No acute fracture. No primary bone lesion or focal pathologic process. Soft tissues and spinal canal: No prevertebral fluid or swelling. No visible canal hematoma. Disc levels: Intervertebral disc heights are maintained. Mild multilevel facet arthropathy and marginal osteophyte formation. Upper chest: Negative. Other: None. IMPRESSION: 1. No acute intracranial abnormality. 2. No acute osseous injury of the cervical spine. 3. Other nonacute observations, as described above. Electronically Signed   By: Ree Molt M.D.   On: 04/06/2024 14:22   CT  Cervical Spine Wo Contrast Result Date: 04/06/2024 CLINICAL DATA:  trauma.; Syncope, fall. EXAM: CT HEAD WITHOUT CONTRAST CT CERVICAL SPINE WITHOUT CONTRAST TECHNIQUE: Multidetector CT imaging of the head and cervical spine was performed following the standard protocol without intravenous contrast. Multiplanar CT image reconstructions of the cervical spine were also generated. RADIATION DOSE REDUCTION: This exam was performed according to the departmental dose-optimization program which includes automated exposure control, adjustment of the mA and/or kV according to patient size and/or use of iterative reconstruction technique. COMPARISON:  None Available. FINDINGS: CT HEAD FINDINGS Brain: No evidence of acute infarction, hemorrhage, hydrocephalus, extra-axial collection or mass lesion/mass effect. There is bilateral periventricular hypodensity, which is non-specific but most likely seen in the settings of microvascular ischemic changes. Mild in extent. Otherwise normal appearance of brain parenchyma. Ventricles are normal. Cerebral  volume is age appropriate. Vascular: No hyperdense vessel or unexpected calcification. Intracranial arteriosclerosis. Skull: Normal. Negative for fracture or focal lesion. Sinuses/Orbits: Small air-fluid level noted in the both chambers of sphenoid sinus, which can be seen with sphenoid sinusitis. Correlate clinically. There is mild-to-moderate mucosal thickening in the ethmoidal air cells. Other: Visualized mastoid air cells are unremarkable. No mastoid effusion. CT CERVICAL SPINE FINDINGS Alignment: There is reversal of cervical lordosis, which can be positional or due to muscle spasm. Please note, this examination does not assess for ligamentous injury or stability. Skull base and vertebrae: No acute fracture. No primary bone lesion or focal pathologic process. Soft tissues and spinal canal: No prevertebral fluid or swelling. No visible canal hematoma. Disc levels: Intervertebral disc  heights are maintained. Mild multilevel facet arthropathy and marginal osteophyte formation. Upper chest: Negative. Other: None. IMPRESSION: 1. No acute intracranial abnormality. 2. No acute osseous injury of the cervical spine. 3. Other nonacute observations, as described above. Electronically Signed   By: Ree Molt M.D.   On: 04/06/2024 14:22   CT Angio Chest PE W and/or Wo Contrast Result Date: 04/06/2024 CLINICAL DATA:  recurrent syncope. EXAM: CT ANGIOGRAPHY CHEST WITH CONTRAST TECHNIQUE: Multidetector CT imaging of the chest was performed using the standard protocol during bolus administration of intravenous contrast. Multiplanar CT image reconstructions and MIPs were obtained to evaluate the vascular anatomy. RADIATION DOSE REDUCTION: This exam was performed according to the departmental dose-optimization program which includes automated exposure control, adjustment of the mA and/or kV according to patient size and/or use of iterative reconstruction technique. CONTRAST:  75mL OMNIPAQUE  IOHEXOL  350 MG/ML SOLN COMPARISON:  None Available. FINDINGS: Cardiovascular: No evidence of embolism to the proximal subsegmental pulmonary artery level. Normal cardiac size. No pericardial effusion. No aortic aneurysm. There are coronary artery calcifications, in keeping with coronary artery disease. There are also mild peripheral atherosclerotic vascular calcifications of thoracic aorta and its major branches. Mediastinum/Nodes: Visualized thyroid gland appears grossly unremarkable. No solid / cystic mediastinal masses. The esophagus is nondistended precluding optimal assessment. There are few mildly prominent mediastinal lymph nodes, which do not meet the size criteria for lymphadenopathy and though indeterminate most likely benign in etiology. No axillary or hilar lymphadenopathy by size criteria. Lungs/Pleura: The central tracheo-bronchial tree is patent. There are patchy areas of linear, plate-like atelectasis  and/or scarring throughout bilateral lungs. No mass or consolidation. No pleural effusion or pneumothorax. No suspicious lung nodules. Upper Abdomen: Visualized upper abdominal viscera within normal limits. Musculoskeletal: The visualized soft tissues of the chest wall are grossly unremarkable. No suspicious osseous lesions. There are mild multilevel degenerative changes in the visualized spine. Review of the MIP images confirms the above findings. IMPRESSION: 1. No embolism to the proximal subsegmental pulmonary artery level. 2. No lung mass, consolidation, pleural effusion or pneumothorax. 3. Multiple other nonacute observations, as described above. Aortic Atherosclerosis (ICD10-I70.0). Electronically Signed   By: Ree Molt M.D.   On: 04/06/2024 14:07       Assessment and Plan: No notes have been filed under this hospital service. Service: Hospitalist 68 y.o. male with medical history significant  for HLD, presented to the emergency department for evaluation of syncope in setting of COVID-19 infection  Syncope  COVID infection  CT head negative, CTA no PE.  Hemodynamically stable saturating appropriately on room air - Check ECHO and orthostatics - may benefit from paxlovid at discharge given risk factors (age, obesity, coronary disease, ?T2DM) for progression to severe disease  - IVF  -  fall and droplet precautions  - monitor on telemetry     Coronary artery calcifications  - noted on CTA with no known history of CAD. CP free. Troponin negative  - ECHO - f/u Cardiology outpatient  - Continue statin, add aspirin  - f/u A1c. Lipid profile 12/2023 with elevated triglycerides   Prediabetes - Hgb A1c 6.0 11/2023.  Chronic currently taking indications.  Repeat A1c   Lovneox  Monitor/replace electrolytes  LR@75  Reg diet    Advance Care Planning:   Code Status: Full Code Discussed with patient at the time of admission      Severity of Illness: The appropriate patient status for  this patient is OBSERVATION. Observation status is judged to be reasonable and necessary in order to provide the required intensity of service to ensure the patient's safety. The patient's presenting symptoms, physical exam findings, and initial radiographic and laboratory data in the context of their medical condition is felt to place them at decreased risk for further clinical deterioration. Furthermore, it is anticipated that the patient will be medically stable for discharge from the hospital within 2 midnights of admission.   Author: Daved JAYSON Pump, DO 04/06/2024 3:41 PM  For on call review [email or web address].

## 2024-04-06 NOTE — ED Provider Notes (Signed)
 [facility] Provider Note    Event Date/Time   First MD Initiated Contact with Patient 04/06/24 1209     (approximate)   History   Loss of Consciousness   HPI  Jason Frye is a 67 y.o. male with a past medical history of hypertension, murmur, hyperlipidemia who presents for urgent care with 2 episodes of syncope witnessed in the setting of 4 to 5 days of cough lightheadedness.  Patient has had congestion decreased appetite and lightheadedness for the past 4 days.  He recently was diagnosed with pneumonia about a month ago and was treated after course of doxycycline .  He does report some ill contacts with his family members.  While at the urgent care he became dizzy was laid down on the exam bed and lost consciousness.  He came to had no focal neurological deficits but had a recurrent episode of syncope and fall in front of staff members afterwards.  EMS was subsequently called.  He reports a worsening headache but denies any hearing or vision changes chest pain abdominal pain, sensation changes or extremity weakness.       Physical Exam   Triage Vital Signs: ED Triage Vitals  Encounter Vitals Group     BP 04/06/24 1212 (!) 153/73     Girls Systolic BP Percentile --      Girls Diastolic BP Percentile --      Boys Systolic BP Percentile --      Boys Diastolic BP Percentile --      Pulse Rate 04/06/24 1212 69     Resp 04/06/24 1212 18     Temp 04/06/24 1212 97.9 F (36.6 C)     Temp Source 04/06/24 1212 Oral     SpO2 04/06/24 1212 100 %     Weight 04/06/24 1211 235 lb (106.6 kg)     Height 04/06/24 1211 5' 11 (1.803 m)     Head Circumference --      Peak Flow --      Pain Score 04/06/24 1209 0     Pain Loc --      Pain Education --      Exclude from Growth Chart --     Most recent vital signs: Vitals:   04/06/24 1212  BP: (!) 153/73  Pulse: 69  Resp: 18  Temp: 97.9 F (36.6 C)  SpO2: 100%      General: Patient is well nourished,  well developed, awake and alert, resting comfortably in no acute distress Head: Normocephalic.  Abrasion on top of his head Eyes: Normal inspection, extraocular muscles intact, no conjunctival pallor Ear, nose, throat: Normal external exam Neck: In cervical collar Respiratory: Patient is in no respiratory distress, lungs CTAB Cardiovascular: Patient is not tachycardic, RRR without murmur appreciated GI: Abd SNT with no guarding or rebound  Back: Normal inspection of the back with good strength and range of motion throughout all ext Extremities: pulses intact with good cap refills, no LE pitting edema or calf tenderness Neuro: The patient is alert and oriented to person, place, and time, appropriately conversive, with 5/5 bilat UE/LE strength, no gross motor or sensory defects noted. Coordination appears to be adequate. Skin: Warm, dry, and intact Psych: normal mood and affect, no SI or HI  ED Results / Procedures / Treatments   Labs (all labs ordered are listed, but only abnormal results are displayed) Labs Reviewed  BASIC METABOLIC PANEL WITH GFR - Abnormal; Notable for the following components:  Result Value   Glucose, Bld 109 (*)    All other components within normal limits  CBC WITH DIFFERENTIAL/PLATELET  BRAIN NATRIURETIC PEPTIDE  MAGNESIUM  HIV ANTIBODY (ROUTINE TESTING W REFLEX)  HEMOGLOBIN A1C  CBC  CREATININE, SERUM  MAGNESIUM  TROPONIN I (HIGH SENSITIVITY)  TROPONIN I (HIGH SENSITIVITY)     EKG EKG and rhythm strip are interpreted by myself:   EKG: [Normal sinus rhythm] at heart rate of 67, normal QRS duration, QTc 443, nononspecific ST segments and T waves no ectopy EKG not consistent with Acute STEMI Rhythm strip: NSR in lead II   RADIOLOGY CT head: No intracranial hemorrhage on my independent review interpretation CT C-spine: No acute abnormality CTA PE: No pulmonary embolism but does have evidence of coronary artery  disease    PROCEDURES:  Critical Care performed: No  Procedures   MEDICATIONS ORDERED IN ED: Medications  rosuvastatin  (CRESTOR ) tablet 10 mg (has no administration in time range)  enoxaparin  (LOVENOX ) injection 52.5 mg (has no administration in time range)  aspirin  EC tablet 81 mg (has no administration in time range)  ondansetron  (ZOFRAN ) tablet 4 mg (has no administration in time range)    Or  ondansetron  (ZOFRAN ) injection 4 mg (has no administration in time range)  acetaminophen  (TYLENOL ) tablet 650 mg (has no administration in time range)    Or  acetaminophen  (TYLENOL ) suppository 650 mg (has no administration in time range)  lactated ringers  infusion (has no administration in time range)  sodium chloride  0.9 % bolus 1,000 mL (1,000 mLs Intravenous New Bag/Given 04/06/24 1222)  iohexol  (OMNIPAQUE ) 350 MG/ML injection 75 mL (75 mLs Intravenous Contrast Given 04/06/24 1343)     IMPRESSION / MDM / ASSESSMENT AND PLAN / ED COURSE                                Differential diagnosis includes, but is not limited to, arrhythmia, intracranial hemorrhage, cervical spine fracture, PE, COVID,  ED course: Patient arrives without any focal neurological deficits but does have evidence of obvious head trauma.  CT head demonstrated no intracranial hemorrhage and CT C-spine was unremarkable and his cervical collar was cleared.  Given his positive COVID results and recurrent syncope I did make the decision that he was high risk [place] and opted for CTA chest which demonstrated no PE but did demonstrate new CAD that patient was unaware of.  He did receive IV fluids.  Given his recurrent syncope which may be secondary to his COVID but also possible CAD/heart problems, decision made to admit to hospitalist overnight and possibly obtain echocardiogram to which the patient was amenable.   Clinical Course as of 04/06/24 1616  Mon Apr 06, 2024  1244 WBC: 7.1 No leukocytosis [HD]  1447 Accepted for  admission by hospitalist [HD]    Clinical Course User Index [HD] Nicholaus Rolland BRAVO, MD     FINAL CLINICAL IMPRESSION(S) / ED DIAGNOSES   Final diagnoses:  Syncope, unspecified syncope type  COVID-19     Rx / DC Orders   ED Discharge Orders     None        Note:  This document was prepared using Dragon voice recognition software and may include unintentional dictation errors.   Nicholaus Rolland BRAVO, MD 04/06/24 [phone]

## 2024-04-06 NOTE — ED Notes (Signed)
 Patient is being discharged from the Urgent Care and sent to the Emergency Department via EMS . Per Lyle Host PA, patient is in need of higher level of care due to syncope or seizure like activity, head injury, left arm injury. Patient is aware and verbalizes understanding of plan of care.  Vitals:   04/06/24 1054 04/06/24 1058  BP: 130/78   Pulse: 76   Resp: 15   Temp:  99 F (37.2 C)  SpO2: 95%

## 2024-04-06 NOTE — ED Triage Notes (Signed)
 I was in Aug 21 and diagnosed with pneumonia.  Everything was going good until Friday, when symptoms re-appeared: cough, congestion, weakness, not hungry. Entered by patient  Pt has taken Mucinex for his symptoms.   During triage pt reported he was feeling lightheaded as if he wanted to faint. As I went to lay the patient down he had a syncope episode accompanied with snoring and staring off. I hit the code button and attempted to raise his feet. He started to jerk mildly and he came to. Brooke PA and [place] CMA came in to assist.

## 2024-04-06 NOTE — Discharge Instructions (Addendum)

## 2024-04-06 NOTE — ED Provider Notes (Signed)
 [facility]    CSN: [id number] Arrival date & time: 04/06/24  1021      History   Chief Complaint Chief Complaint  Patient presents with   Follow-up    I was in Aug 21 and diagnosed with pneumonia.  Everything was going good until Friday, when symptoms re-appeared: cough, congestion, weakness, not hungry.  See you Monday.  Thank you! - Entered by patient    HPI Jason Frye is a 68 y.o. male presenting for 4-day history of cough, congestion, reduced appetite and fatigue.  Patient was seen here 17 days ago by myself and found to have lingular pneumonia.  He was treated with doxycycline  course.  He states he took all the medication and was feeling better until a few days ago.  Does not believe he has had any fevers then denies sinus pain, sore throat, chest pain or shortness of breath.  No known COVID exposure.  While in urgent care, as patient is getting his vital signs checked by nurse, he started to feel a little dizzy.  She laid him back on the exam bed and states he began snoring and passed out but his eyes were open.  States he was not conscious of what was happening at that time.  States his legs started to get shaky and within a few seconds he came to.  I spoke with patient immediately after this happened and he stated that he was feeling little fatigued after but denies any continued dizziness and no headaches, chest pain, palpitations or shortness of breath.  Normal neurological exam and patient recalled encounter from a couple weeks ago.  He also reported that he drove himself to the urgent care.  Stated that he had not eaten anything today.  Past medical history significant for hypertension, prediabetes and hyperlipidemia.  No history of seizures or syncope.   HPI  Past Medical History:  Diagnosis Date   Allergy    Arthritis    Glaucoma    Hyperlipidemia    Hypertension     Patient Active Problem List   Diagnosis Date Noted   Prediabetes 01/07/2024    Primary hypertension 05/11/2021   Cardiac murmur 01/04/2021   Hyperlipidemia 01/04/2021   DISH (diffuse idiopathic skeletal hyperostosis) 01/04/2021   Other fatigue 01/04/2021    Past Surgical History:  Procedure Laterality Date   COLONOSCOPY WITH PROPOFOL  N/A 06/16/2021   Procedure: COLONOSCOPY WITH PROPOFOL ;  Surgeon: Janalyn Keene NOVAK, MD;  Location: [facility] ENDOSCOPY;  Service: Endoscopy;  Laterality: N/A;   TONSILLECTOMY         Home Medications    Prior to Admission medications   Medication Sig Start Date End Date Taking? Authorizing Provider  B Complex Vitamins (VITAMIN B-COMPLEX) TABS Take 1 tablet by mouth daily.    [provider]  meloxicam  (MOBIC ) 15 MG tablet Take 1 tablet by mouth daily. 01/26/22   [provider]  Multiple Vitamin (MULTI-VITAMINS) TABS Take by mouth.    [provider]  promethazine -dextromethorphan (PROMETHAZINE -DM) 6.25-15 MG/5ML syrup Take 5 mLs by mouth 4 (four) times daily as needed. 03/19/24   Arvis Jolan NOVAK, PA-C  rosuvastatin  (CRESTOR ) 10 MG tablet Take 1 tablet (10 mg total) by mouth daily. 12/04/23   Herold Hadassah SQUIBB, MD    Family History Family History  Problem Relation Age of Onset   Cancer Mother    Heart Problems Mother    Cancer Father    Heart disease Father    Heart Problems Sister  Cancer Sister    Breast cancer Sister    Heart Problems Sister    Cancer Sister    Heart disease Sister    Thyroid cancer Sister     Social History Social History   Tobacco Use   Smoking status: Never   Smokeless tobacco: Never  Vaping Use   Vaping status: Never Used  Substance Use Topics   Alcohol use: Yes    Comment: on occasion   Drug use: Never     Allergies   Penicillins   Review of Systems Review of Systems  Constitutional:  Positive for appetite change and fatigue. Negative for fever.  HENT:  Positive for congestion and rhinorrhea. Negative for sinus pressure, sinus pain and sore throat.    Respiratory:  Positive for cough. Negative for shortness of breath.   Cardiovascular:  Negative for chest pain and palpitations.  Gastrointestinal:  Negative for abdominal pain, diarrhea, nausea and vomiting.  Musculoskeletal:  Negative for myalgias.  Neurological:  Positive for dizziness, syncope and headaches. Negative for weakness and light-headedness.  Hematological:  Negative for adenopathy.     Physical Exam Triage Vital Signs ED Triage Vitals  Encounter Vitals Group     BP 04/06/24 1054 130/78     Girls Systolic BP Percentile --      Girls Diastolic BP Percentile --      Boys Systolic BP Percentile --      Boys Diastolic BP Percentile --      Pulse Rate 04/06/24 1054 76     Resp 04/06/24 1054 15     Temp 04/06/24 1058 99 F (37.2 C)     Temp Source 04/06/24 1058 Oral     SpO2 04/06/24 1054 95 %     Weight 04/06/24 1048 236 lb (107 kg)     Height --      Head Circumference --      Peak Flow --      Pain Score 04/06/24 1057 0     Pain Loc --      Pain Education --      Exclude from Growth Chart --    No data found.  Updated Vital Signs BP 130/78 (BP Location: Right Arm)   Pulse 76   Temp 99 F (37.2 C) (Oral)   Resp 15   Wt 236 lb (107 kg)   SpO2 95%   BMI 32.92 kg/m    Physical Exam Vitals and nursing note reviewed.  Constitutional:      General: He is not in acute distress.    Appearance: Normal appearance. He is well-developed. He is not ill-appearing.  HENT:     Head:     Comments: After second syncopal episode, patient found to have superficial abrasion with bleeding of head/scalp.    Nose: Nose normal.     Mouth/Throat:     Mouth: Mucous membranes are moist.     Pharynx: Oropharynx is clear.     Comments: Lacerations of tongue due to biting his tongue.  Bleeding in mouth. Eyes:     General: No scleral icterus.    Conjunctiva/sclera: Conjunctivae normal.  Cardiovascular:     Rate and Rhythm: Normal rate and regular rhythm.  Pulmonary:      Effort: Pulmonary effort is normal. No respiratory distress.     Breath sounds: Normal breath sounds.  Musculoskeletal:     Cervical back: Neck supple.  Skin:    General: Skin is warm and dry.     Capillary  Refill: Capillary refill takes less than 2 seconds.  Neurological:     Mental Status: He is alert.     Comments: Patient initially alert and oriented and able to recall past events.  After second syncopal episode patient does not recall where he is or why he came in.  Does not recall a previous conversation we had 15 to 20 minutes before.  Normal neuroexam.  Full strength of upper and lower extremities.  Psychiatric:        Mood and Affect: Mood normal.      UC Treatments / Results  Labs (all labs ordered are listed, but only abnormal results are displayed) Labs Reviewed  RESP PANEL BY RT-PCR (FLU A&B, COVID) ARPGX2 - Abnormal; Notable for the following components:      Result Value   SARS Coronavirus 2 by RT PCR POSITIVE (*)    All other components within normal limits  GLUCOSE, CAPILLARY  CBG MONITORING, ED    EKG   Radiology No results found.  Procedures ED EKG  Date/Time: 04/06/2024 11:48 AM  Performed by: Arvis Jolan NOVAK, PA-C Authorized by: Arvis Jolan NOVAK, PA-C   Previous ECG:    Previous ECG:  Compared to current   Similarity:  No change Interpretation:    Interpretation: normal   Rate:    ECG rate:  69   ECG rate assessment: normal   Rhythm:    Rhythm: sinus rhythm   Ectopy:    Ectopy: none   QRS:    QRS axis:  Normal   QRS intervals:  Normal   QRS conduction: normal   ST segments:    ST segments:  Normal T waves:    T waves: normal   Comments:     Normal sinus rhythm. Regular rate.   (including critical care time)  Medications Ordered in UC Medications - No data to display  Initial Impression / Assessment and Plan / UC Course  I have reviewed the triage vital signs and the nursing notes.  Pertinent labs & imaging results that were  available during my care of the patient were reviewed by me and considered in my medical decision making (see chart for details).   68 y/o male presenting for 4-day history of cough, congestion, reduced appetite and fatigue.  Patient was seen here 17 days ago by myself and found to have lingular pneumonia.  He was treated with doxycycline  course.  He states he took all the medication and was feeling better until a few days ago.   While in urgent care, as patient is getting his vital signs checked by nurse, he started to feel a little dizzy.  She laid him back on the exam bed and states he began snoring and passed out but his eyes were open.  States he was not conscious of what was happening at that time.  States his legs started to get shaky and within a few seconds he came to.  I spoke with patient immediately after this happened and he stated that he was feeling little fatigued after but denies any continued dizziness and no headaches, chest pain, palpitations or shortness of breath.  Normal neurological exam and patient recalled encounter from a couple weeks ago.  He also reported that he drove himself to the urgent care.  Stated that he had not eaten anything today.  Fingerstick glucose 99.  EKG shows normal sinus rhythm and regular rate.   Ordered chest xray and resp panel.   While awaiting test  results, patient was advised to inform us  if he started to feel dizzy again.  Patient's exam room is close to nursing station to monitor.  About 15 minutes or so after initial syncopal episode/seizure-like activity, nursing staff and myself heard a loud noise come from patient's room.  Walked into room to find him laying on the floor on his right side.  Complaining of left-sided shoulder pain.  At that time he was also found to have bitten his tongue and have an abrasion of his head with bleeding.  Patient was noted to have generalized shaking and within a few seconds started to come to again.  He said he  felt a little dizzy and then suddenly woke up on the floor.  Myself and nursing staff bandaged patient's wound and helped him onto a stretcher, moving him to a different room.  Normal cranial nerve exam but patient now cannot remember why he came into the urgent care and cannot identify the city that he is in.  He says he does not remember seeing me a couple weeks ago and previously we had an entire discussion about this.  EMS contacted immediately.  Advised patient that he will need to be sent immediately to the emergency department for further workup of the syncopal episodes and seizure-like activity.  He is understanding and agrees to this.  Recheck of vital signs are stable.  A detailed report was given to first responders and patient being transferred to [facility] ED.  COVID test came back positive.   *** Final Clinical Impressions(s) / UC Diagnoses   Final diagnoses:  Syncope, unspecified syncope type  Acute cough  Seizure-like activity (HCC)  COVID-19  History of pneumonia     Discharge Instructions      You have been advised to follow up immediately in the emergency department for concerning signs.symptoms. If you declined EMS transport, please have a family member take you directly to the ED at this time. Do not delay. Based on concerns about condition, if you do not follow up in th e ED, you may risk poor outcomes including worsening of condition, delayed treatment and potentially life threatening issues. If you have declined to go to the ED at this time, you should call your PCP immediately to set up a follow up appointment.  Go to ED for red flag symptoms, including; fevers you cannot reduce with Tylenol /Motrin, severe headaches, vision changes, numbness/weakness in part of the body, lethargy, confusion, intractable vomiting, severe dehydration, chest pain, breathing difficulty, severe persistent abdominal or pelvic pain, signs of severe infection (increased redness, swelling of an area),  feeling faint or passing out, dizziness, etc. You should especially go to the ED for sudden acute worsening of condition if you do not elect to go at this time.     ED Prescriptions   None    PDMP not reviewed this encounter.

## 2024-04-06 NOTE — Progress Notes (Signed)
 PHARMACIST - PHYSICIAN COMMUNICATION  CONCERNING:  Enoxaparin  (Lovenox ) for DVT Prophylaxis    RECOMMENDATION: Patient was prescribed enoxaprin 40mg  q24 hours for VTE prophylaxis.   Filed Weights   04/06/24 1211  Weight: 106.6 kg (235 lb)    Body mass index is 32.78 kg/m.  Estimated Creatinine Clearance: 84.7 mL/min (by C-G formula based on SCr of 1.08 mg/dL).  Based on [facility] policy patient is candidate for enoxaparin  0.5mg /kg TBW SQ every 24 hours based on BMI being >30.  DESCRIPTION: Pharmacy has adjusted enoxaparin  dose per [facility] policy.  Patient is now receiving enoxaparin  52.5 mg every 24 hours    Damien Napoleon, PharmD Clinical Pharmacist  04/06/2024 3:49 PM

## 2024-04-07 ENCOUNTER — Observation Stay
Admit: 2024-04-07 | Discharge: 2024-04-07 | Disposition: A | Discharge status: Home / Self Care | Attending: Emergency Medicine | Admitting: Emergency Medicine

## 2024-04-07 DIAGNOSIS — U071 COVID-19: Secondary | ICD-10-CM

## 2024-04-07 DIAGNOSIS — R55 Syncope and collapse: Secondary | ICD-10-CM | POA: Diagnosis not present

## 2024-04-07 LAB — HEMOGLOBIN A1C
Hgb A1c MFr Bld: 5.9 % — ABNORMAL HIGH (ref 4.8–5.6)
Mean Plasma Glucose: 122.63 mg/dL

## 2024-04-07 LAB — BASIC METABOLIC PANEL WITH GFR
Anion gap: 11 (ref 5–15)
BUN: 17 mg/dL (ref 8–23)
CO2: 26 mmol/L (ref 22–32)
Calcium: 8.8 mg/dL — ABNORMAL LOW (ref 8.9–10.3)
Chloride: 103 mmol/L (ref 98–111)
Creatinine, Ser: 1.21 mg/dL (ref 0.61–1.24)
GFR, Estimated: >60 mL/min (ref >60)
Glucose, Bld: 97 mg/dL (ref 70–99)
Potassium: 3.9 mmol/L (ref 3.5–5.1)
Sodium: 140 mmol/L (ref 135–145)

## 2024-04-07 LAB — ECHOCARDIOGRAM COMPLETE
AR max vel: 1.06 cm2
AV Area VTI: 1.14 cm2
AV Area mean vel: 1.05 cm2
AV Mean grad: 21 mmHg
AV Peak grad: 30.6 mmHg
Ao pk vel: 2.77 m/s
Area-P 1/2: 3.12 cm2
Height: 71 in
S' Lateral: 2.99 cm
Weight: 3760 [oz_av]

## 2024-04-07 LAB — CBC
HCT: 40.2 % (ref 39.0–52.0)
Hemoglobin: 13 g/dL (ref 13.0–17.0)
MCH: 29.5 pg (ref 26.0–34.0)
MCHC: 32.3 g/dL (ref 30.0–36.0)
MCV: 91.4 fL (ref 80.0–100.0)
Platelets: 209 K/uL (ref 150–400)
RBC: 4.4 MIL/uL (ref 4.22–5.81)
RDW: 13 % (ref 11.5–15.5)
WBC: 6.7 K/uL (ref 4.0–10.5)
nRBC: 0 % (ref 0.0–0.2)

## 2024-04-07 LAB — HIV ANTIBODY (ROUTINE TESTING W REFLEX): HIV Screen 4th Generation wRfx: NONREACTIVE

## 2024-04-07 MED ORDER — ORAL CARE MOUTH RINSE: 15.0000 mL | OROMUCOSAL | Status: DC | PRN

## 2024-04-07 NOTE — Progress Notes (Signed)
 [facility]  - [place] [facility]   PATIENT NAME: Jason Frye    MR#:  [id number]  DATE OF BIRTH:  Jun 24, 1956  SUBJECTIVE:  sitting up in the chair and eating lunch. Ambulates to the bathroom. Denies dizziness improving. Received IV fluids. Encourage oral fluids. Denies any fever.    VITALS:  Blood pressure (!) 139/48, pulse 71, temperature 98.2 F (36.8 C), temperature source Oral, resp. rate 18, height 5' 11 (1.803 m), weight 106.6 kg, SpO2 99%.  PHYSICAL EXAMINATION:   GENERAL:  68 y.o.-year-old patient with no acute distress.  LUNGS: Normal breath sounds bilaterally, no wheezing CARDIOVASCULAR: S1, S2 normal. No murmur   ABDOMEN: Soft, nontender, nondistended. Bowel sounds present.  EXTREMITIES: No  edema b/l.    NEUROLOGIC: nonfocal  patient is alert and awake SKIN: No obvious rash, lesion, or ulcer.   LABORATORY PANEL:  CBC Recent Labs  Lab 04/07/24 0447  WBC 6.7  HGB 13.0  HCT 40.2  PLT 209    Chemistries  Recent Labs  Lab 04/06/24 2212 04/07/24 0447  NA  --  140  K  --  3.9  CL  --  103  CO2  --  26  GLUCOSE  --  97  BUN  --  17  CREATININE  --  1.21  CALCIUM   --  8.8*  MG 2.1  --    Cardiac Enzymes No results for input(s): TROPONINI in the last 168 hours. RADIOLOGY:  CT Head Wo Contrast Result Date: 04/06/2024 CLINICAL DATA:  trauma.; Syncope, fall. EXAM: CT HEAD WITHOUT CONTRAST CT CERVICAL SPINE WITHOUT CONTRAST TECHNIQUE: Multidetector CT imaging of the head and cervical spine was performed following the standard protocol without intravenous contrast. Multiplanar CT image reconstructions of the cervical spine were also generated. RADIATION DOSE REDUCTION: This exam was performed according to the departmental dose-optimization program which includes automated exposure control, adjustment of the mA and/or kV according to patient size and/or use of iterative reconstruction technique. COMPARISON:  None Available. FINDINGS: CT  HEAD FINDINGS Brain: No evidence of acute infarction, hemorrhage, hydrocephalus, extra-axial collection or mass lesion/mass effect. There is bilateral periventricular hypodensity, which is non-specific but most likely seen in the settings of microvascular ischemic changes. Mild in extent. Otherwise normal appearance of brain parenchyma. Ventricles are normal. Cerebral volume is age appropriate. Vascular: No hyperdense vessel or unexpected calcification. Intracranial arteriosclerosis. Skull: Normal. Negative for fracture or focal lesion. Sinuses/Orbits: Small air-fluid level noted in the both chambers of sphenoid sinus, which can be seen with sphenoid sinusitis. Correlate clinically. There is mild-to-moderate mucosal thickening in the ethmoidal air cells. Other: Visualized mastoid air cells are unremarkable. No mastoid effusion. CT CERVICAL SPINE FINDINGS Alignment: There is reversal of cervical lordosis, which can be positional or due to muscle spasm. Please note, this examination does not assess for ligamentous injury or stability. Skull base and vertebrae: No acute fracture. No primary bone lesion or focal pathologic process. Soft tissues and spinal canal: No prevertebral fluid or swelling. No visible canal hematoma. Disc levels: Intervertebral disc heights are maintained. Mild multilevel facet arthropathy and marginal osteophyte formation. Upper chest: Negative. Other: None. IMPRESSION: 1. No acute intracranial abnormality. 2. No acute osseous injury of the cervical spine. 3. Other nonacute observations, as described above. Electronically Signed   By: Ree Molt M.D.   On: 04/06/2024 14:22   CT Cervical Spine Wo Contrast Result Date: 04/06/2024 CLINICAL DATA:  trauma.; Syncope, fall. EXAM: CT HEAD WITHOUT CONTRAST CT CERVICAL SPINE WITHOUT  CONTRAST TECHNIQUE: Multidetector CT imaging of the head and cervical spine was performed following the standard protocol without intravenous contrast. Multiplanar CT  image reconstructions of the cervical spine were also generated. RADIATION DOSE REDUCTION: This exam was performed according to the departmental dose-optimization program which includes automated exposure control, adjustment of the mA and/or kV according to patient size and/or use of iterative reconstruction technique. COMPARISON:  None Available. FINDINGS: CT HEAD FINDINGS Brain: No evidence of acute infarction, hemorrhage, hydrocephalus, extra-axial collection or mass lesion/mass effect. There is bilateral periventricular hypodensity, which is non-specific but most likely seen in the settings of microvascular ischemic changes. Mild in extent. Otherwise normal appearance of brain parenchyma. Ventricles are normal. Cerebral volume is age appropriate. Vascular: No hyperdense vessel or unexpected calcification. Intracranial arteriosclerosis. Skull: Normal. Negative for fracture or focal lesion. Sinuses/Orbits: Small air-fluid level noted in the both chambers of sphenoid sinus, which can be seen with sphenoid sinusitis. Correlate clinically. There is mild-to-moderate mucosal thickening in the ethmoidal air cells. Other: Visualized mastoid air cells are unremarkable. No mastoid effusion. CT CERVICAL SPINE FINDINGS Alignment: There is reversal of cervical lordosis, which can be positional or due to muscle spasm. Please note, this examination does not assess for ligamentous injury or stability. Skull base and vertebrae: No acute fracture. No primary bone lesion or focal pathologic process. Soft tissues and spinal canal: No prevertebral fluid or swelling. No visible canal hematoma. Disc levels: Intervertebral disc heights are maintained. Mild multilevel facet arthropathy and marginal osteophyte formation. Upper chest: Negative. Other: None. IMPRESSION: 1. No acute intracranial abnormality. 2. No acute osseous injury of the cervical spine. 3. Other nonacute observations, as described above. Electronically Signed   By:  Ree Molt M.D.   On: 04/06/2024 14:22   CT Angio Chest PE W and/or Wo Contrast Result Date: 04/06/2024 CLINICAL DATA:  recurrent syncope. EXAM: CT ANGIOGRAPHY CHEST WITH CONTRAST TECHNIQUE: Multidetector CT imaging of the chest was performed using the standard protocol during bolus administration of intravenous contrast. Multiplanar CT image reconstructions and MIPs were obtained to evaluate the vascular anatomy. RADIATION DOSE REDUCTION: This exam was performed according to the departmental dose-optimization program which includes automated exposure control, adjustment of the mA and/or kV according to patient size and/or use of iterative reconstruction technique. CONTRAST:  75mL OMNIPAQUE  IOHEXOL  350 MG/ML SOLN COMPARISON:  None Available. FINDINGS: Cardiovascular: No evidence of embolism to the proximal subsegmental pulmonary artery level. Normal cardiac size. No pericardial effusion. No aortic aneurysm. There are coronary artery calcifications, in keeping with coronary artery disease. There are also mild peripheral atherosclerotic vascular calcifications of thoracic aorta and its major branches. Mediastinum/Nodes: Visualized thyroid gland appears grossly unremarkable. No solid / cystic mediastinal masses. The esophagus is nondistended precluding optimal assessment. There are few mildly prominent mediastinal lymph nodes, which do not meet the size criteria for lymphadenopathy and though indeterminate most likely benign in etiology. No axillary or hilar lymphadenopathy by size criteria. Lungs/Pleura: The central tracheo-bronchial tree is patent. There are patchy areas of linear, plate-like atelectasis and/or scarring throughout bilateral lungs. No mass or consolidation. No pleural effusion or pneumothorax. No suspicious lung nodules. Upper Abdomen: Visualized upper abdominal viscera within normal limits. Musculoskeletal: The visualized soft tissues of the chest wall are grossly unremarkable. No suspicious  osseous lesions. There are mild multilevel degenerative changes in the visualized spine. Review of the MIP images confirms the above findings. IMPRESSION: 1. No embolism to the proximal subsegmental pulmonary artery level. 2. No lung mass, consolidation, pleural effusion or  pneumothorax. 3. Multiple other nonacute observations, as described above. Aortic Atherosclerosis (ICD10-I70.0). Electronically Signed   By: Ree Molt M.D.   On: 04/06/2024 14:07    Assessment and Plan  EMRAN MOLZAHN is a 68 y.o. male with medical history significant  for HLD, presented to the emergency department for evaluation of syncope that occurred at urgent care earlier today. Patient was being evaluated for sore throat and nonproductive cough that started on Friday.  While sitting in his appointment answering questions, he synopsized on 2 separate occasions.  Both times were precipitated by a sensation of dizziness  He tested positive for COVID-19.    CTA negative for PE and without evidence of pneumonia. Coronary atherosclerosis was incidentally noted.   CT head and C-spine unremarkable.    Syncope  COVID infection  --CT head negative, CTA no PE.  Hemodynamically stable saturating appropriately on room air -received IV fluids for ortho stasis -- patient ambulating in the room to the bathroom -encourage oral hydration - fall and droplet precautions  - monitor on telemetry -- remains in sinus rhythm will DC tally    Coronary artery calcifications  - noted on CTA with no known history of CAD. CP free. Troponin negative  - ECHO - f/u Cardiology outpatient --defer to PCP for referral - Continue statin, add aspirin    Prediabetes - Hgb A1c 6.0 11/2023.   --not on any meds    Procedures: Family communication none Consults :n/a CODE STATUS: full DVT Prophylaxis : enoxaparin  Level of care: Telemetry Medical Status is: Observation The patient remains OBS appropriate and will d/c before 2 midnights.     TOTAL TIME TAKING CARE OF THIS PATIENT: 30 minutes.  >50% time spent on counselling and coordination of care  Note: This dictation was prepared with Dragon dictation along with smaller phrase technology. Any transcriptional errors that result from this process are unintentional.  Leita Blanch M.D    [facility]   CC: Primary care physician; Melvin Pao, NP

## 2024-04-07 NOTE — Progress Notes (Signed)
 Mobility Specialist - Progress Note Pt amb to the bathroom and returned to bed w/ supervision-- tolerated well. Pt left supine with alarm set and needs within reach.  America Silvan Mobility Specialist 04/07/24 3:41 PM

## 2024-04-07 NOTE — Care Management Obs Status (Signed)
 MEDICARE OBSERVATION STATUS NOTIFICATION   Patient Details  Name: Jason Frye MRN: [id number] Date of Birth: 09/06/1955   Medicare Observation Status Notification Given:  Yes spoke with patient's daughter, Jason Frye at [phone].  Patient in isolation Covid.    Rojelio SHAUNNA Rattler 04/07/2024, 2:38 PM

## 2024-04-07 NOTE — Progress Notes (Signed)
*  PRELIMINARY RESULTS* Echocardiogram 2D Echocardiogram has been performed.  [name] 04/07/2024, 11:26 AM

## 2024-04-07 NOTE — Plan of Care (Signed)
  Problem: Education: Goal: Knowledge of General Education information will improve Description: Including pain rating scale, medication(s)/side effects and non-pharmacologic comfort measures Outcome: Progressing   Problem: Health Behavior/Discharge Planning: Goal: Ability to manage health-related needs will improve Outcome: Progressing   Problem: Clinical Measurements: Goal: Ability to maintain clinical measurements within normal limits will improve Outcome: Progressing Goal: Will remain free from infection Outcome: Progressing Goal: Diagnostic test results will improve Outcome: Progressing Goal: Respiratory complications will improve Outcome: Progressing Goal: Cardiovascular complication will be avoided Outcome: Progressing   Problem: Activity: Goal: Risk for activity intolerance will decrease Outcome: Progressing   Problem: Nutrition: Goal: Adequate nutrition will be maintained Outcome: Progressing   Problem: Safety: Goal: Ability to remain free from injury will improve Outcome: Progressing   Problem: Skin Integrity: Goal: Risk for impaired skin integrity will decrease Outcome: Progressing   Problem: Pain Managment: Goal: General experience of comfort will improve and/or be controlled Outcome: Progressing

## 2024-04-07 NOTE — Plan of Care (Signed)

## 2024-04-08 ENCOUNTER — Encounter: Admitting: Nurse Practitioner

## 2024-04-08 DIAGNOSIS — I951 Orthostatic hypotension: Secondary | ICD-10-CM | POA: Diagnosis not present

## 2024-04-08 MED ORDER — ASPIRIN 81 MG PO TBEC: 81.0000 mg | DELAYED_RELEASE_TABLET | Freq: Every day | ORAL | 12 refills | Status: AC

## 2024-04-08 MED ORDER — ACETAMINOPHEN 325 MG PO TABS: 650.0000 mg | ORAL_TABLET | Freq: Four times a day (QID) | ORAL | 0 refills | Status: DC | PRN

## 2024-04-08 NOTE — TOC Progression Note (Signed)
 Transition of Care [facility]) - Progression Note    Patient Details  Name: Jason Frye MRN: [id number] Date of Birth: August 30, 1955  [facility] CM/SW Contact  Marinda Cooks, RN Phone Number: 04/08/2024, 4:22 PM  Clinical Narrative:    This CM updated by covering MD pt medically cleared to dc today and has active DC order . This CM alerted that by MD he placed an order for [facility] PT and DME. This CM  spoke with pt regarding dc recommendations  he declined [facility] PT and DME. [facility] .No additional DC needs requested by medical team or identified by CM at this time .                       Expected Discharge Plan and Services         Expected Discharge Date: 04/08/24                                     Social Drivers of Health (SDOH) Interventions SDOH Screenings   Food Insecurity: No Food Insecurity (04/06/2024)  Housing: Low Risk  (04/06/2024)  Transportation Needs: No Transportation Needs (04/06/2024)  Utilities: Not At Risk (04/07/2024)  Alcohol Screen: Low Risk  (04/05/2024)  Depression (PHQ2-9): Low Risk  (12/04/2023)  Financial Resource Strain: Low Risk  (04/05/2024)  Physical Activity: Sufficiently Active (04/05/2024)  Social Connections: Socially Isolated (04/06/2024)  Stress: No Stress Concern Present (04/05/2024)  Tobacco Use: Low Risk  (04/06/2024)  Health Literacy: Adequate Health Literacy (01/07/2024)    Readmission Risk Interventions     No data to display

## 2024-04-08 NOTE — Discharge Summary (Addendum)
 Physician Discharge Summary   Patient: Jason Frye MRN: [id number] DOB: April 23, 1956  Admit date:     04/06/2024  Discharge date: 04/08/24  Discharge Physician: Drue ONEIDA Potter   PCP: Melvin Pao, NP   Recommendations at discharge:  Follow-up with primary care physician  Discharge Diagnoses:  Syncope  COVID infection   Coronary artery calcifications  Prediabetes  Hospital Course: Jason Frye is a 68 y.o. male with medical history significant  for HLD, presented to the emergency department for evaluation of syncope that occurred at urgent care earlier on the day of presentation. Patient was being evaluated for sore throat and nonproductive cough that started on Friday.  While sitting in his appointment answering questions, he synopsized on 2 separate occasions.  Both times were precipitated by a sensation of dizzinesS He tested positive for COVID-19.   CTA negative for PE and without evidence of pneumonia. Coronary atherosclerosis was incidentally noted.  During hospitalization patient was found to have orthostatic hypotension that improved with IV hydration.  Patient is no longer symptomatic and therefore being discharged today to follow-up with PCP. Discharge home with home health     Consultants: None Procedures performed: None Disposition: Home Diet recommendation:  Cardiac diet DISCHARGE MEDICATION: Allergies as of 04/08/2024       Reactions   Penicillins         Medication List     TAKE these medications    acetaminophen  325 MG tablet Commonly known as: TYLENOL  Take 2 tablets (650 mg total) by mouth every 6 (six) hours as needed for mild pain (pain score 1-3) or fever (or Fever >/= 101).   aspirin  EC 81 MG tablet Take 1 tablet (81 mg total) by mouth daily. Swallow whole. Start taking on: April 09, 2024   Multi-Vitamins Tabs Take by mouth.   rosuvastatin  10 MG tablet Commonly known as: CRESTOR  Take 1 tablet (10 mg total) by mouth daily.    Vitamin B-Complex Tabs Take 1 tablet by mouth daily.               Durable Medical Equipment  (From admission, onward)           Start     Ordered   04/08/24 1553  For home use only DME 4 wheeled rolling walker with seat  Once       Question:  Patient needs a walker to treat with the following condition  Answer:  Ambulatory dysfunction   04/08/24 1552            Discharge Exam: Filed Weights   04/06/24 1211  Weight: 106.6 kg   GENERAL:  68 y.o.-year-old patient with no acute distress.  LUNGS: Normal breath sounds bilaterally, no wheezing CARDIOVASCULAR: S1, S2 normal. No murmur   ABDOMEN: Soft, nontender, nondistended. Bowel sounds present.  EXTREMITIES: No  edema b/l.    NEUROLOGIC: nonfocal  patient is alert and awake SKIN: No obvious rash, lesion, or ulcer.   Condition at discharge: good  The results of significant diagnostics from this hospitalization (including imaging, microbiology, ancillary and laboratory) are listed below for reference.   Imaging Studies: ECHOCARDIOGRAM COMPLETE Result Date: 04/07/2024    ECHOCARDIOGRAM REPORT   Patient Name:   Jason Frye Date of Exam: 04/07/2024 Medical Rec #:  [id number]      Height:       71.0 in Accession #:    [id number]     Weight:       235.0 lb Date of Birth:  25-May-1956     BSA:          2.258 m Patient Age:    67 years       BP:           133/68 mmHg Patient Gender: M              HR:           63 bpm. Exam Location:  [facility] Procedure: 2D Echo, Cardiac Doppler and Color Doppler (Both Spectral and Color            Flow Doppler were utilized during procedure). Indications:     Syncope R55  History:         Patient has prior history of Echocardiogram examinations, most                  recent 01/25/2021. Risk Factors:Hypertension.  Sonographer:     Christopher Furnace Referring Phys:  [id number] Jason Frye [facility] Diagnosing Phys: Lonni Hanson MD IMPRESSIONS  1. Left ventricular ejection fraction, by estimation, is 55 to 60%. The  left ventricle has normal function. Left ventricular endocardial border not optimally defined to evaluate regional wall motion. There is moderate asymmetric left ventricular hypertrophy of the basal-septal segment. Left ventricular diastolic parameters are consistent with Grade I diastolic dysfunction (impaired relaxation).  2. Right ventricular systolic function is normal. The right ventricular size is normal. Tricuspid regurgitation signal is inadequate for assessing PA pressure.  3. The mitral valve is normal in structure. No evidence of mitral valve regurgitation. No evidence of mitral stenosis.  4. The aortic valve was not well visualized. There is mild calcification of the aortic valve. There is mild thickening of the aortic valve. Aortic valve regurgitation is not visualized. Moderate aortic valve stenosis. Aortic valve area, by VTI measures 1.14 cm. Aortic valve mean gradient measures 21.0 mmHg. FINDINGS  Left Ventricle: Left ventricular ejection fraction, by estimation, is 55 to 60%. The left ventricle has normal function. Left ventricular endocardial border not optimally defined to evaluate regional wall motion. The left ventricular internal cavity size was normal in size. There is moderate asymmetric left ventricular hypertrophy of the basal-septal segment. Left ventricular diastolic parameters are consistent with Grade I diastolic dysfunction (impaired relaxation). Right Ventricle: The right ventricular size is normal. No increase in right ventricular wall thickness. Right ventricular systolic function is normal. Tricuspid regurgitation signal is inadequate for assessing PA pressure. Left Atrium: Left atrial size was normal in size. Right Atrium: Right atrial size was normal in size. Pericardium: The pericardium was not well visualized. Mitral Valve: The mitral valve is normal in structure. No evidence of mitral valve regurgitation. No evidence of mitral valve stenosis. Tricuspid Valve: The tricuspid  valve is normal in structure. Tricuspid valve regurgitation is trivial. Aortic Valve: The aortic valve was not well visualized. There is mild calcification of the aortic valve. There is mild thickening of the aortic valve. Aortic valve regurgitation is not visualized. Moderate aortic stenosis is present. Aortic valve mean gradient measures 21.0 mmHg. Aortic valve peak gradient measures 30.6 mmHg. Aortic valve area, by VTI measures 1.14 cm. Pulmonic Valve: The pulmonic valve was not well visualized. Pulmonic valve regurgitation is trivial. No evidence of pulmonic stenosis. Aorta: The aortic root is normal in size and structure. Pulmonary Artery: The pulmonary artery is not well seen. Venous: The inferior vena cava was not well visualized. IAS/Shunts: The interatrial septum was not well visualized.  LEFT VENTRICLE PLAX 2D LVIDd:  4.16 cm   Diastology LVIDs:         2.99 cm   LV e' medial:    5.51 cm/s LV PW:         1.02 cm   LV E/e' medial:  10.7 LV IVS:        1.60 cm   LV e' lateral:   5.32 cm/s LVOT diam:     2.00 cm   LV E/e' lateral: 11.1 LV SV:         62 LV SV Index:   27 LVOT Area:     3.14 cm  RIGHT VENTRICLE RV Basal diam:  3.91 cm RV Mid diam:    3.50 cm LEFT ATRIUM             Index        RIGHT ATRIUM           Index LA diam:        3.30 cm 1.46 cm/m   RA Area:     17.20 cm LA Vol (A2C):   28.4 ml 12.58 ml/m  RA Volume:   50.70 ml  22.45 ml/m LA Vol (A4C):   67.1 ml 29.71 ml/m LA Biplane Vol: 44.8 ml 19.84 ml/m  AORTIC VALVE AV Area (Vmax):    1.06 cm AV Area (Vmean):   1.05 cm AV Area (VTI):     1.14 cm AV Vmax:           276.50 cm/s AV Vmean:          206.500 cm/s AV VTI:            0.538 m AV Peak Grad:      30.6 mmHg AV Mean Grad:      21.0 mmHg LVOT Vmax:         93.10 cm/s LVOT Vmean:        68.900 cm/s LVOT VTI:          0.196 m LVOT/AV VTI ratio: 0.36  AORTA Ao Root diam: 3.50 cm MITRAL VALVE MV Area (PHT): 3.12 cm    SHUNTS MV Decel Time: 243 msec    Systemic VTI:  0.20 m MV E  velocity: 59.20 cm/s  Systemic Diam: 2.00 cm MV A velocity: 80.50 cm/s MV E/A ratio:  0.74 Lonni End MD Electronically signed by Lonni Hanson MD Signature Date/Time: 04/07/2024/6:17:11 PM    Final    CT Head Wo Contrast Result Date: 04/06/2024 CLINICAL DATA:  trauma.; Syncope, fall. EXAM: CT HEAD WITHOUT CONTRAST CT CERVICAL SPINE WITHOUT CONTRAST TECHNIQUE: Multidetector CT imaging of the head and cervical spine was performed following the standard protocol without intravenous contrast. Multiplanar CT image reconstructions of the cervical spine were also generated. RADIATION DOSE REDUCTION: This exam was performed according to the departmental dose-optimization program which includes automated exposure control, adjustment of the mA and/or kV according to patient size and/or use of iterative reconstruction technique. COMPARISON:  None Available. FINDINGS: CT HEAD FINDINGS Brain: No evidence of acute infarction, hemorrhage, hydrocephalus, extra-axial collection or mass lesion/mass effect. There is bilateral periventricular hypodensity, which is non-specific but most likely seen in the settings of microvascular ischemic changes. Mild in extent. Otherwise normal appearance of brain parenchyma. Ventricles are normal. Cerebral volume is age appropriate. Vascular: No hyperdense vessel or unexpected calcification. Intracranial arteriosclerosis. Skull: Normal. Negative for fracture or focal lesion. Sinuses/Orbits: Small air-fluid level noted in the both chambers of sphenoid sinus, which can be seen with sphenoid sinusitis. Correlate clinically. There is  mild-to-moderate mucosal thickening in the ethmoidal air cells. Other: Visualized mastoid air cells are unremarkable. No mastoid effusion. CT CERVICAL SPINE FINDINGS Alignment: There is reversal of cervical lordosis, which can be positional or due to muscle spasm. Please note, this examination does not assess for ligamentous injury or stability. Skull base and  vertebrae: No acute fracture. No primary bone lesion or focal pathologic process. Soft tissues and spinal canal: No prevertebral fluid or swelling. No visible canal hematoma. Disc levels: Intervertebral disc heights are maintained. Mild multilevel facet arthropathy and marginal osteophyte formation. Upper chest: Negative. Other: None. IMPRESSION: 1. No acute intracranial abnormality. 2. No acute osseous injury of the cervical spine. 3. Other nonacute observations, as described above. Electronically Signed   By: Ree Molt M.D.   On: 04/06/2024 14:22   CT Cervical Spine Wo Contrast Result Date: 04/06/2024 CLINICAL DATA:  trauma.; Syncope, fall. EXAM: CT HEAD WITHOUT CONTRAST CT CERVICAL SPINE WITHOUT CONTRAST TECHNIQUE: Multidetector CT imaging of the head and cervical spine was performed following the standard protocol without intravenous contrast. Multiplanar CT image reconstructions of the cervical spine were also generated. RADIATION DOSE REDUCTION: This exam was performed according to the departmental dose-optimization program which includes automated exposure control, adjustment of the mA and/or kV according to patient size and/or use of iterative reconstruction technique. COMPARISON:  None Available. FINDINGS: CT HEAD FINDINGS Brain: No evidence of acute infarction, hemorrhage, hydrocephalus, extra-axial collection or mass lesion/mass effect. There is bilateral periventricular hypodensity, which is non-specific but most likely seen in the settings of microvascular ischemic changes. Mild in extent. Otherwise normal appearance of brain parenchyma. Ventricles are normal. Cerebral volume is age appropriate. Vascular: No hyperdense vessel or unexpected calcification. Intracranial arteriosclerosis. Skull: Normal. Negative for fracture or focal lesion. Sinuses/Orbits: Small air-fluid level noted in the both chambers of sphenoid sinus, which can be seen with sphenoid sinusitis. Correlate clinically. There is  mild-to-moderate mucosal thickening in the ethmoidal air cells. Other: Visualized mastoid air cells are unremarkable. No mastoid effusion. CT CERVICAL SPINE FINDINGS Alignment: There is reversal of cervical lordosis, which can be positional or due to muscle spasm. Please note, this examination does not assess for ligamentous injury or stability. Skull base and vertebrae: No acute fracture. No primary bone lesion or focal pathologic process. Soft tissues and spinal canal: No prevertebral fluid or swelling. No visible canal hematoma. Disc levels: Intervertebral disc heights are maintained. Mild multilevel facet arthropathy and marginal osteophyte formation. Upper chest: Negative. Other: None. IMPRESSION: 1. No acute intracranial abnormality. 2. No acute osseous injury of the cervical spine. 3. Other nonacute observations, as described above. Electronically Signed   By: Ree Molt M.D.   On: 04/06/2024 14:22   CT Angio Chest PE W and/or Wo Contrast Result Date: 04/06/2024 CLINICAL DATA:  recurrent syncope. EXAM: CT ANGIOGRAPHY CHEST WITH CONTRAST TECHNIQUE: Multidetector CT imaging of the chest was performed using the standard protocol during bolus administration of intravenous contrast. Multiplanar CT image reconstructions and MIPs were obtained to evaluate the vascular anatomy. RADIATION DOSE REDUCTION: This exam was performed according to the departmental dose-optimization program which includes automated exposure control, adjustment of the mA and/or kV according to patient size and/or use of iterative reconstruction technique. CONTRAST:  75mL OMNIPAQUE  IOHEXOL  350 MG/ML SOLN COMPARISON:  None Available. FINDINGS: Cardiovascular: No evidence of embolism to the proximal subsegmental pulmonary artery level. Normal cardiac size. No pericardial effusion. No aortic aneurysm. There are coronary artery calcifications, in keeping with coronary artery disease. There are also mild peripheral  atherosclerotic vascular  calcifications of thoracic aorta and its major branches. Mediastinum/Nodes: Visualized thyroid gland appears grossly unremarkable. No solid / cystic mediastinal masses. The esophagus is nondistended precluding optimal assessment. There are few mildly prominent mediastinal lymph nodes, which do not meet the size criteria for lymphadenopathy and though indeterminate most likely benign in etiology. No axillary or hilar lymphadenopathy by size criteria. Lungs/Pleura: The central tracheo-bronchial tree is patent. There are patchy areas of linear, plate-like atelectasis and/or scarring throughout bilateral lungs. No mass or consolidation. No pleural effusion or pneumothorax. No suspicious lung nodules. Upper Abdomen: Visualized upper abdominal viscera within normal limits. Musculoskeletal: The visualized soft tissues of the chest wall are grossly unremarkable. No suspicious osseous lesions. There are mild multilevel degenerative changes in the visualized spine. Review of the MIP images confirms the above findings. IMPRESSION: 1. No embolism to the proximal subsegmental pulmonary artery level. 2. No lung mass, consolidation, pleural effusion or pneumothorax. 3. Multiple other nonacute observations, as described above. Aortic Atherosclerosis (ICD10-I70.0). Electronically Signed   By: Ree Molt M.D.   On: 04/06/2024 14:07   DG Chest 2 View Result Date: 03/19/2024 EXAM: 2 VIEW(S) XRAY OF THE CHEST 03/19/2024 04:36:34 PM COMPARISON: None available. CLINICAL HISTORY: Cough and congestion x 2 weeks. FINDINGS: LUNGS AND PLEURA: Suspect patchy inferior lingular opacity. Right lung clear. No pulmonary edema. No pleural effusion. No pneumothorax. HEART AND MEDIASTINUM: No acute abnormality of the cardiac and mediastinal silhouettes. BONES AND SOFT TISSUES: Vertebral endplate spurring at multiple levels in the lower thoracic spine. No acute osseous abnormality. IMPRESSION: 1. Suspect patchy inferior lingular opacity. 2. Right  lung clear. Electronically signed by: Dayne Hassell MD 03/19/2024 04:41 PM EDT RP Workstation: [id number]    Microbiology: Results for orders placed or performed during the hospital encounter of 04/06/24  Resp Panel by RT-PCR (Flu A&B, Covid) Anterior Nasal Swab     Status: Abnormal   Collection Time: 04/06/24 11:01 AM   Specimen: Anterior Nasal Swab  Result Value Ref Range Status   SARS Coronavirus 2 by RT PCR POSITIVE (A) NEGATIVE Final    Comment: (NOTE) SARS-CoV-2 target nucleic acids are DETECTED.  The SARS-CoV-2 RNA is generally detectable in upper respiratory specimens during the acute phase of infection. Positive results are indicative of the presence of the identified virus, but do not rule out bacterial infection or co-infection with other pathogens not detected by the test. Clinical correlation with patient history and other diagnostic information is necessary to determine patient infection status. The expected result is Negative.  Fact Sheet for Patients: BloggerCourse.com  Fact Sheet for Healthcare Providers: SeriousBroker.it  This test is not yet approved or cleared by the [place]  FDA and  has been authorized for detection and/or diagnosis of SARS-CoV-2 by FDA under an Emergency Use Authorization (EUA).  This EUA will remain in effect (meaning this test can be used) for the duration of  the COVID-19 declaration under Section 564(b)(1) of the A ct, 21 U.S.C. section 360bbb-3(b)(1), unless the authorization is terminated or revoked sooner.     Influenza A by PCR NEGATIVE NEGATIVE Final   Influenza B by PCR NEGATIVE NEGATIVE Final    Comment: (NOTE) The Xpert Xpress SARS-CoV-2/FLU/RSV plus assay is intended as an aid in the diagnosis of influenza from Nasopharyngeal swab specimens and should not be used as a sole basis for treatment. Nasal washings and aspirates are unacceptable for Xpert Xpress  SARS-CoV-2/FLU/RSV testing.  Fact Sheet for Patients: BloggerCourse.com  Fact Sheet for Healthcare Providers: SeriousBroker.it  This test is not yet approved or cleared by the [place]  FDA and has been authorized for detection and/or diagnosis of SARS-CoV-2 by FDA under an Emergency Use Authorization (EUA). This EUA will remain in effect (meaning this test can be used) for the duration of the COVID-19 declaration under Section 564(b)(1) of the Act, 21 U.S.C. section 360bbb-3(b)(1), unless the authorization is terminated or revoked.  Performed at [facility], [address] [place], [place] [address]     Labs: CBC: Recent Labs  Lab 04/06/24 1211 04/07/24 0447  WBC 7.1 6.7  NEUTROABS 3.7  --   HGB 13.3 13.0  HCT 41.0 40.2  MCV 91.5 91.4  PLT 218 209   Basic Metabolic Panel: Recent Labs  Lab 04/06/24 1211 04/06/24 2212 04/07/24 0447  NA 139  --  140  K 4.2  --  3.9  CL 104  --  103  CO2 25  --  26  GLUCOSE 109*  --  97  BUN 19  --  17  CREATININE 1.08  --  1.21  CALCIUM  8.9  --  8.8*  MG 2.2 2.1  --    Liver Function Tests: No results for input(s): AST, ALT, ALKPHOS, BILITOT, PROT, ALBUMIN in the last 168 hours. CBG: Recent Labs  Lab 04/06/24 1114  GLUCAP 99    Discharge time spent:  34 minutes.  Signed: Drue ONEIDA Potter, MD [facility] 04/08/2024

## 2024-04-08 NOTE — Evaluation (Signed)
 Physical Therapy Evaluation Patient Details Name: Jason Frye MRN: [id number] DOB: 30-Oct-1955 Today's Date: 04/08/2024  History of Present Illness  Jason Frye is a 67 y.o. male with medical history significant  for HLD, presented to the emergency department for evaluation of syncope that occurred at urgent care earlier today.  Patient was being evaluated for sore throat and nonproductive cough that started on Friday.  While sitting in his appointment answering questions, he synopsized on 2 separate occasions.  Both times were precipitated by a sensation of dizziness. No seizure activity, bladder or bowel incontinence, or tongue biting noted.  He denies fevers, chills, nausea, vomiting, diarrhea, abdominal pain, weakness, numbness or paresthesias.  He tested positive for COVID-19.   Clinical Impression  Pt admitted with above diagnosis. Pt currently with functional limitations due to the deficits listed below (see PT Problem List). Pt received supine in bed agreeable to PT eval. PTA reports living alone and fully independent.   To date, pt is independent with bed mobility, STS, and ambulation ~200'. Mildly unsteady with x2 small balance losses but pt able to correct independently. Pt also independent with toileting tasks returning to supine in bed with all needs in reach. Pt would benefit from DME and f/u recs to address acute imbalance from illness to reduce falls risk.       If plan is discharge home, recommend the following: Assist for transportation   Can travel by private vehicle        Equipment Recommendations Rolling walker (2 wheels)  Recommendations for Other Services       Functional Status Assessment Patient has had a recent decline in their functional status and demonstrates the ability to make significant improvements in function in a reasonable and predictable amount of time.     Precautions / Restrictions Precautions Precautions: Fall Restrictions Weight Bearing  Restrictions Per Provider Order: No      Mobility  Bed Mobility Overal bed mobility: Independent                  Transfers Overall transfer level: Independent                      Ambulation/Gait Ambulation/Gait assistance: Independent Gait Distance (Feet): 200 Feet Assistive device: None Gait Pattern/deviations: WFL(Within Functional Limits)       General Gait Details: mildly unsteady with x2 sway to the R but able to correct independently.  Stairs            Wheelchair Mobility     Tilt Bed    Modified Rankin (Stroke Patients Only)       Balance Overall balance assessment: Mild deficits observed, not formally tested                                           Pertinent Vitals/Pain Pain Assessment Pain Assessment: No/denies pain    Home Living Family/patient expects to be discharged to:: Private residence Living Arrangements: Alone Available Help at Discharge: Family;Available PRN/intermittently Type of Home: House Home Access: Level entry       Home Layout: One level Home Equipment: None      Prior Function Prior Level of Function : Independent/Modified Independent             Mobility Comments: drives, still plays recreational basketball       Extremity/Trunk Assessment  Communication   Communication Communication: No apparent difficulties    Cognition Arousal: Alert Behavior During Therapy: WFL for tasks assessed/performed   PT - Cognitive impairments: No apparent impairments                         Following commands: Intact       Cueing Cueing Techniques: Verbal cues     General Comments      Exercises Other Exercises Other Exercises: Role of PT in acute setting, pacing strategies, d/c recs   Assessment/Plan    PT Assessment Patient needs continued PT services  PT Problem List Decreased balance       PT Treatment Interventions DME  instruction;Gait training;Patient/family education;Stair training;Functional mobility training;Therapeutic activities;Therapeutic exercise;Balance training;Neuromuscular re-education    PT Goals (Current goals can be found in the Care Plan section)  Acute Rehab PT Goals Patient Stated Goal: to go home PT Goal Formulation: With patient Time For Goal Achievement: 04/22/24 Potential to Achieve Goals: Good    Frequency Min 1X/week     Co-evaluation               AM-PAC PT 6 Clicks Mobility  Outcome Measure Help needed turning from your back to your side while in a flat bed without using bedrails?: None Help needed moving from lying on your back to sitting on the side of a flat bed without using bedrails?: None Help needed moving to and from a bed to a chair (including a wheelchair)?: None Help needed standing up from a chair using your arms (e.g., wheelchair or bedside chair)?: None Help needed to walk in hospital room?: A Little Help needed climbing 3-5 steps with a railing? : A Little 6 Click Score: 22    End of Session   Activity Tolerance: Patient tolerated treatment well Patient left: in bed;with call bell/phone within reach Nurse Communication: Mobility status PT Visit Diagnosis: Unsteadiness on feet (R26.81)    Time: 8474-8458 PT Time Calculation (min) (ACUTE ONLY): 16 min   Charges:   PT Evaluation $PT Eval Low Complexity: 1 Low   PT General Charges $$ ACUTE PT VISIT: 1 Visit        Dorina HERO. Fairly IV, PT, DPT Physical Therapist- [place]  [facility] 04/08/2024, 3:50 PM

## 2024-04-09 ENCOUNTER — Telehealth: Payer: Self-pay

## 2024-04-09 NOTE — Transitions of Care (Post Inpatient/ED Visit) (Signed)
   04/09/2024  Name: Jason Frye MRN: [id number] DOB: [birth year]  Today's TOC FU Call Status: Today's TOC FU Call Status:: Successful TOC FU Call Completed TOC FU Call Complete Date: 04/09/24 Patient's Name and Date of Birth confirmed.  Transition Care Management Follow-up Telephone Call Date of Discharge: 04/08/24 Discharge Facility: [facility]) Type of Discharge: Inpatient Admission Primary Inpatient Discharge Diagnosis:: COVID How have you been since you were released from the hospital?: Better Any questions or concerns?: No  Items Reviewed: Did you receive and understand the discharge instructions provided?: Yes Medications obtained,verified, and reconciled?: Yes (Medications Reviewed) Any new allergies since your discharge?: No Dietary orders reviewed?: Yes Do you have support at home?: No  Medications Reviewed Today: Medications Reviewed Today     Reviewed by Emmitt Pan, LPN (Licensed Practical Nurse) on 04/09/24 at 1120  Med List Status: <None>   Medication Order Taking? Sig Documenting Provider Last Dose Status Informant  acetaminophen  (TYLENOL ) 325 MG tablet [id number] Yes Take 2 tablets (650 mg total) by mouth every 6 (six) hours as needed for mild pain (pain score 1-3) or fever (or Fever >/= 101). Dorinda Drue DASEN, MD  Active   aspirin  EC 81 MG tablet [id number] Yes Take 1 tablet (81 mg total) by mouth daily. Swallow whole. Dorinda Drue DASEN, MD  Active   B Complex Vitamins (VITAMIN B-COMPLEX) TABS [id number] Yes Take 1 tablet by mouth daily. [provider]  Active Self  Multiple Vitamin (MULTI-VITAMINS) TABS [id number] Yes Take by mouth. [provider]  Active Self  rosuvastatin  (CRESTOR ) 10 MG tablet [id number] Yes Take 1 tablet (10 mg total) by mouth daily. Herold Hadassah SQUIBB, MD  Active Self            Home Care and Equipment/Supplies: Were Home Health Services Ordered?: NA Any new equipment or medical supplies  ordered?: NA  Functional Questionnaire: Do you need assistance with bathing/showering or dressing?: No Do you need assistance with meal preparation?: No Do you need assistance with eating?: No Do you have difficulty maintaining continence: No Do you need assistance with getting out of bed/getting out of a chair/moving?: No Do you have difficulty managing or taking your medications?: No  Follow up appointments reviewed: PCP Follow-up appointment confirmed?: Yes Date of PCP follow-up appointment?: 04/15/24 Follow-up Provider: [facility] Follow-up appointment confirmed?: NA Do you need transportation to your follow-up appointment?: No Do you understand care options if your condition(s) worsen?: Yes-patient verbalized understanding    SIGNATURE Pan Emmitt, LPN [facility] Nurse Health Advisor Direct Dial [phone]

## 2024-04-15 ENCOUNTER — Ambulatory Visit: Admitting: Nurse Practitioner

## 2024-04-15 ENCOUNTER — Encounter: Payer: Self-pay | Admitting: Nurse Practitioner

## 2024-04-15 VITALS — BP 126/84 | HR 78 | Temp 98.4°F | Ht 71.0 in | Wt 235.4 lb

## 2024-04-15 DIAGNOSIS — I700 Atherosclerosis of aorta: Secondary | ICD-10-CM

## 2024-04-15 DIAGNOSIS — E785 Hyperlipidemia, unspecified: Secondary | ICD-10-CM | POA: Diagnosis not present

## 2024-04-15 DIAGNOSIS — Z09 Encounter for follow-up examination after completed treatment for conditions other than malignant neoplasm: Secondary | ICD-10-CM

## 2024-04-15 MED ORDER — ROSUVASTATIN CALCIUM 20 MG PO TABS
20.0000 mg | ORAL_TABLET | Freq: Every day | ORAL | 1 refills | Status: DC
Start: 2024-04-15 — End: 2024-04-22

## 2024-04-15 NOTE — Progress Notes (Signed)
 BP 126/84 (BP Location: Right Arm, Patient Position: Sitting, Cuff Size: Large)   Pulse 78   Temp 98.4 F (36.9 C)   Ht 5' 11 (1.803 m)   Wt 235 lb 6.4 oz (106.8 kg)   SpO2 98%   BMI 32.83 kg/m    Subjective:    Patient ID: Jason Frye, male    DOB: October 31, 1955, 68 y.o.   MRN: [id number]  HPI: CREWS MCCOLLAM is a 68 y.o. male  Chief Complaint  Patient presents with   Follow-up    Pt presents for Hospital follow up, Pt was dx with COVID & experienced Syncope hence why he was hospitalized     Transition of Care Hospital Follow up.   Hospital/Facility: [facility] Physician: Dr. Dorinda D/C Date: 04/08/24  Records Requested: NA Records Received: Yes Records Reviewed: Yes  Diagnoses on Discharge:   Syncope  COVID infection   Coronary artery calcifications  Prediabetes  Symptoms have improved.  Having some residual fatigue but overall feeling well.    Date of interactive Contact within 48 hours of discharge:  Contact was through: phone  Date of 7 day or 14 day face-to-face visit:    within 14 days  Outpatient Encounter Medications as of 04/15/2024  Medication Sig   acetaminophen  (TYLENOL ) 325 MG tablet Take 2 tablets (650 mg total) by mouth every 6 (six) hours as needed for mild pain (pain score 1-3) or fever (or Fever >/= 101).   aspirin  EC 81 MG tablet Take 1 tablet (81 mg total) by mouth daily. Swallow whole.   B Complex Vitamins (VITAMIN B-COMPLEX) TABS Take 1 tablet by mouth daily.   Multiple Vitamin (MULTI-VITAMINS) TABS Take by mouth.   [DISCONTINUED] rosuvastatin  (CRESTOR ) 10 MG tablet Take 1 tablet (10 mg total) by mouth daily.   rosuvastatin  (CRESTOR ) 20 MG tablet Take 1 tablet (20 mg total) by mouth daily.   No facility-administered encounter medications on file as of 04/15/2024.    Diagnostic Tests Reviewed/Disposition: Reviewed  Consults: Reviewed  Discharge Instructions: reviewed  Disease/illness Education: Reviewed  Home Health/Community  Services Discussions/Referrals: NA  Establishment or re-establishment of referral orders for community resources: NA  Discussion with other health care providers: NA  Assessment and Support of treatment regimen adherence: Discussed during visit  Appointments Coordinated with: Patient  Education for self-management, independent living, and ADLs: NA  Relevant past medical, surgical, family and social history reviewed and updated as indicated. Interim medical history since our last visit reviewed. Allergies and medications reviewed and updated.  Review of Systems  Constitutional:  Positive for fatigue.    Per HPI unless specifically indicated above     Objective:    BP 126/84 (BP Location: Right Arm, Patient Position: Sitting, Cuff Size: Large)   Pulse 78   Temp 98.4 F (36.9 C)   Ht 5' 11 (1.803 m)   Wt 235 lb 6.4 oz (106.8 kg)   SpO2 98%   BMI 32.83 kg/m   Wt Readings from Last 3 Encounters:  04/15/24 235 lb 6.4 oz (106.8 kg)  04/06/24 235 lb (106.6 kg)  04/06/24 236 lb (107 kg)    Physical Exam Vitals and nursing note reviewed.  Constitutional:      General: He is not in acute distress.    Appearance: Normal appearance. He is not ill-appearing, toxic-appearing or diaphoretic.  HENT:     Head: Normocephalic.     Right Ear: External ear normal.     Left Ear: External ear normal.  Nose: Nose normal. No congestion or rhinorrhea.     Mouth/Throat:     Mouth: Mucous membranes are moist.  Eyes:     General:        Right eye: No discharge.        Left eye: No discharge.     Extraocular Movements: Extraocular movements intact.     Conjunctiva/sclera: Conjunctivae normal.     Pupils: Pupils are equal, round, and reactive to light.  Cardiovascular:     Rate and Rhythm: Normal rate and regular rhythm.     Heart sounds: No murmur heard. Pulmonary:     Effort: Pulmonary effort is normal. No respiratory distress.     Breath sounds: Normal breath sounds. No wheezing,  rhonchi or rales.  Abdominal:     General: Abdomen is flat. Bowel sounds are normal.  Musculoskeletal:     Cervical back: Normal range of motion and neck supple.  Skin:    General: Skin is warm and dry.     Capillary Refill: Capillary refill takes less than 2 seconds.  Neurological:     General: No focal deficit present.     Mental Status: He is alert and oriented to person, place, and time.  Psychiatric:        Mood and Affect: Mood normal.        Behavior: Behavior normal.        Thought Content: Thought content normal.        Judgment: Judgment normal.     Results for orders placed or performed during the hospital encounter of 04/06/24  CBC with Differential   Collection Time: 04/06/24 12:11 PM  Result Value Ref Range   WBC 7.1 4.0 - 10.5 K/uL   RBC 4.48 4.22 - 5.81 MIL/uL   Hemoglobin 13.3 13.0 - 17.0 g/dL   HCT 58.9 60.9 - 47.9 %   MCV 91.5 80.0 - 100.0 fL   MCH 29.7 26.0 - 34.0 pg   MCHC 32.4 30.0 - 36.0 g/dL   RDW 86.7 88.4 - 84.4 %   Platelets 218 150 - 400 K/uL   nRBC 0.0 0.0 - 0.2 %   Neutrophils Relative % 54 %   Neutro Abs 3.7 1.7 - 7.7 K/uL   Lymphocytes Relative 27 %   Lymphs Abs 2.0 0.7 - 4.0 K/uL   Monocytes Relative 14 %   Monocytes Absolute 1.0 0.1 - 1.0 K/uL   Eosinophils Relative 5 %   Eosinophils Absolute 0.4 0.0 - 0.5 K/uL   Basophils Relative 0 %   Basophils Absolute 0.0 0.0 - 0.1 K/uL   Immature Granulocytes 0 %   Abs Immature Granulocytes 0.03 0.00 - 0.07 K/uL  Basic metabolic panel   Collection Time: 04/06/24 12:11 PM  Result Value Ref Range   Sodium 139 135 - 145 mmol/L   Potassium 4.2 3.5 - 5.1 mmol/L   Chloride 104 98 - 111 mmol/L   CO2 25 22 - 32 mmol/L   Glucose, Bld 109 (H) 70 - 99 mg/dL   BUN 19 8 - 23 mg/dL   Creatinine, Ser 8.91 0.61 - 1.24 mg/dL   Calcium  8.9 8.9 - 10.3 mg/dL   GFR, Estimated >39 >39 mL/min   Anion gap 10 5 - 15  Brain natriuretic peptide   Collection Time: 04/06/24 12:11 PM  Result Value Ref Range   B  Natriuretic Peptide 54.7 0.0 - 100.0 pg/mL  Magnesium   Collection Time: 04/06/24 12:11 PM  Result Value Ref Range  Magnesium 2.2 1.7 - 2.4 mg/dL  Troponin I (High Sensitivity)   Collection Time: 04/06/24 12:11 PM  Result Value Ref Range   Troponin I (High Sensitivity) 6 <18 ng/L  Hemoglobin A1c   Collection Time: 04/06/24 10:12 PM  Result Value Ref Range   Hgb A1c MFr Bld 5.9 (H) 4.8 - 5.6 %   Mean Plasma Glucose 122.63 mg/dL  HIV Antibody (routine testing w rflx)   Collection Time: 04/06/24 10:12 PM  Result Value Ref Range   HIV Screen 4th Generation wRfx Non Reactive Non Reactive  Magnesium   Collection Time: 04/06/24 10:12 PM  Result Value Ref Range   Magnesium 2.1 1.7 - 2.4 mg/dL  Troponin I (High Sensitivity)   Collection Time: 04/06/24 10:12 PM  Result Value Ref Range   Troponin I (High Sensitivity) 12 <18 ng/L  Basic metabolic panel   Collection Time: 04/07/24  4:47 AM  Result Value Ref Range   Sodium 140 135 - 145 mmol/L   Potassium 3.9 3.5 - 5.1 mmol/L   Chloride 103 98 - 111 mmol/L   CO2 26 22 - 32 mmol/L   Glucose, Bld 97 70 - 99 mg/dL   BUN 17 8 - 23 mg/dL   Creatinine, Ser 8.78 0.61 - 1.24 mg/dL   Calcium  8.8 (L) 8.9 - 10.3 mg/dL   GFR, Estimated >39 >39 mL/min   Anion gap 11 5 - 15  CBC   Collection Time: 04/07/24  4:47 AM  Result Value Ref Range   WBC 6.7 4.0 - 10.5 K/uL   RBC 4.40 4.22 - 5.81 MIL/uL   Hemoglobin 13.0 13.0 - 17.0 g/dL   HCT 59.7 60.9 - 47.9 %   MCV 91.4 80.0 - 100.0 fL   MCH 29.5 26.0 - 34.0 pg   MCHC 32.3 30.0 - 36.0 g/dL   RDW 86.9 88.4 - 84.4 %   Platelets 209 150 - 400 K/uL   nRBC 0.0 0.0 - 0.2 %  ECHOCARDIOGRAM COMPLETE   Collection Time: 04/07/24 11:26 AM  Result Value Ref Range   Weight 3,760 oz   Height 71 in   BP 128/59 mmHg   Ao pk vel 2.77 m/s   AV Area VTI 1.14 cm2   AR max vel 1.06 cm2   AV Mean grad 21.0 mmHg   AV Peak grad 30.6 mmHg   S' Lateral 2.99 cm   AV Area mean vel 1.05 cm2   Area-P 1/2 3.12 cm2    Est EF 55 - 60%       Assessment & Plan:   Problem List Items Addressed This Visit       Cardiovascular and Mediastinum   Aortic atherosclerosis (HCC)   Chronic.  Continue with ASA.  Increased Crestor  to 20mg  daily.  Found on CT during hospitalization.       Relevant Medications   rosuvastatin  (CRESTOR ) 20 MG tablet     Other   Hyperlipidemia   Relevant Medications   rosuvastatin  (CRESTOR ) 20 MG tablet   Other Visit Diagnoses       Hospital discharge follow-up    -  Primary   Doing well since discharge. CBC and CMP checked at visit today.   Relevant Orders   Comp Met (CMET)   CBC w/Diff        Follow up plan: No follow-ups on file.

## 2024-04-15 NOTE — Assessment & Plan Note (Signed)
 Chronic.  Continue with ASA.  Increased Crestor  to 20mg  daily.  Found on CT during hospitalization.

## 2024-04-16 ENCOUNTER — Ambulatory Visit: Payer: Self-pay | Admitting: Nurse Practitioner

## 2024-04-16 LAB — CBC WITH DIFFERENTIAL/PLATELET
Basophils Absolute: 0 x10E3/uL (ref 0.0–0.2)
Basos: 0 %
EOS (ABSOLUTE): 0.3 x10E3/uL (ref 0.0–0.4)
Eos: 4 %
Hematocrit: 43.3 % (ref 37.5–51.0)
Hemoglobin: 14.1 g/dL (ref 13.0–17.7)
Immature Grans (Abs): 0.1 x10E3/uL (ref 0.0–0.1)
Immature Granulocytes: 1 %
Lymphocytes Absolute: 2.7 x10E3/uL (ref 0.7–3.1)
Lymphs: 32 %
MCH: 29.5 pg (ref 26.6–33.0)
MCHC: 32.6 g/dL (ref 31.5–35.7)
MCV: 91 fL (ref 79–97)
Monocytes Absolute: 0.8 x10E3/uL (ref 0.1–0.9)
Monocytes: 9 %
Neutrophils Absolute: 4.4 x10E3/uL (ref 1.4–7.0)
Neutrophils: 54 %
Platelets: 383 x10E3/uL (ref 150–450)
RBC: 4.78 x10E6/uL (ref 4.14–5.80)
RDW: 12.9 % (ref 11.6–15.4)
WBC: 8.2 x10E3/uL (ref 3.4–10.8)

## 2024-04-16 LAB — COMPREHENSIVE METABOLIC PANEL WITH GFR
ALT: 24 IU/L (ref 0–44)
AST: 23 IU/L (ref 0–40)
Albumin: 4.3 g/dL (ref 3.9–4.9)
Alkaline Phosphatase: 90 IU/L (ref 47–123)
BUN/Creatinine Ratio: 17 (ref 10–24)
BUN: 17 mg/dL (ref 8–27)
Bilirubin Total: 0.2 mg/dL (ref 0.0–1.2)
CO2: 23 mmol/L (ref 20–29)
Calcium: 10.3 mg/dL — ABNORMAL HIGH (ref 8.6–10.2)
Chloride: 99 mmol/L (ref 96–106)
Creatinine, Ser: 1 mg/dL (ref 0.76–1.27)
Globulin, Total: 3.3 g/dL (ref 1.5–4.5)
Glucose: 87 mg/dL (ref 70–99)
Potassium: 4.4 mmol/L (ref 3.5–5.2)
Sodium: 139 mmol/L (ref 134–144)
Total Protein: 7.6 g/dL (ref 6.0–8.5)
eGFR: 84 mL/min/1.73

## 2024-04-21 NOTE — Progress Notes (Signed)
 Call me at [phone] (or text!) and I'm happy to explain.   But BNP is very much a part of a recurent syncope workup.

## 2024-04-22 ENCOUNTER — Encounter: Payer: Self-pay | Admitting: Nurse Practitioner

## 2024-04-22 ENCOUNTER — Ambulatory Visit (INDEPENDENT_AMBULATORY_CARE_PROVIDER_SITE_OTHER): Admitting: Nurse Practitioner

## 2024-04-22 VITALS — BP 128/83 | HR 82 | Ht 71.0 in | Wt 236.0 lb

## 2024-04-22 DIAGNOSIS — Z0000 Encounter for general adult medical examination without abnormal findings: Secondary | ICD-10-CM | POA: Diagnosis not present

## 2024-04-22 DIAGNOSIS — R7303 Prediabetes: Secondary | ICD-10-CM | POA: Diagnosis not present

## 2024-04-22 DIAGNOSIS — E785 Hyperlipidemia, unspecified: Secondary | ICD-10-CM | POA: Diagnosis not present

## 2024-04-22 DIAGNOSIS — Z7189 Other specified counseling: Secondary | ICD-10-CM | POA: Diagnosis not present

## 2024-04-22 DIAGNOSIS — I10 Essential (primary) hypertension: Secondary | ICD-10-CM

## 2024-04-22 DIAGNOSIS — I700 Atherosclerosis of aorta: Secondary | ICD-10-CM | POA: Diagnosis not present

## 2024-04-22 MED ORDER — ROSUVASTATIN CALCIUM 20 MG PO TABS: 20.0000 mg | ORAL_TABLET | Freq: Every day | ORAL | 1 refills | Status: AC

## 2024-04-22 NOTE — Assessment & Plan Note (Signed)
 Chronic.  Continue with ASA. Continue Crestor  to 20mg  daily.  Found on CT during hospitalization in September 2025.

## 2024-04-22 NOTE — Progress Notes (Signed)
 Subjective:    Jason Frye is a 68 y.o. male who presents for a Welcome to Medicare exam.        Objective:    Today's Vitals   04/22/24 0910  BP: 128/83  Pulse: 82  SpO2: 96%  Weight: 236 lb (107 kg)  Height: 5' 11 (1.803 m)   Body mass index is 32.92 kg/m.  Medications Outpatient Encounter Medications as of 04/22/2024  Medication Sig   acetaminophen  (TYLENOL ) 325 MG tablet Take 2 tablets (650 mg total) by mouth every 6 (six) hours as needed for mild pain (pain score 1-3) or fever (or Fever >/= 101).   aspirin  EC 81 MG tablet Take 1 tablet (81 mg total) by mouth daily. Swallow whole.   B Complex Vitamins (VITAMIN B-COMPLEX) TABS Take 1 tablet by mouth daily.   Multiple Vitamin (MULTI-VITAMINS) TABS Take by mouth.   [DISCONTINUED] rosuvastatin  (CRESTOR ) 20 MG tablet Take 1 tablet (20 mg total) by mouth daily.   rosuvastatin  (CRESTOR ) 20 MG tablet Take 1 tablet (20 mg total) by mouth daily.   No facility-administered encounter medications on file as of 04/22/2024.     History: Past Medical History:  Diagnosis Date   Allergy    Arthritis    Glaucoma    Hyperlipidemia    Hypertension    Past Surgical History:  Procedure Laterality Date   COLONOSCOPY WITH PROPOFOL  N/A 06/16/2021   Procedure: COLONOSCOPY WITH PROPOFOL ;  Surgeon: Janalyn Keene NOVAK, MD;  Location: [facility] ENDOSCOPY;  Service: Endoscopy;  Laterality: N/A;   TONSILLECTOMY      Family History  Problem Relation Age of Onset   Cancer Mother    Heart Problems Mother    Cancer Father    Heart disease Father    Heart Problems Sister    Cancer Sister    Breast cancer Sister    Heart Problems Sister    Cancer Sister    Heart disease Sister    Thyroid cancer Sister    Social History   Occupational History   Not on file  Tobacco Use   Smoking status: Never   Smokeless tobacco: Never  Vaping Use   Vaping status: Never Used  Substance and Sexual Activity   Alcohol use: Yes    Comment: on  occasion   Drug use: Never   Sexual activity: Not Currently    Tobacco Counseling Counseling given: Not Answered   Immunizations and Health Maintenance Immunization History  Administered Date(s) Administered   PFIZER(Purple Top)SARS-COV-2 Vaccination 10/23/2019, 11/13/2019   Zoster Recombinant(Shingrix) 04/19/2020, 10/16/2020   Health Maintenance Due  Topic Date Due   DTaP/Tdap/Td (1 - Tdap) Never done   COVID-19 Vaccine (3 - 2025-26 season) 03/30/2024    Activities of Daily Living    04/22/2024    9:12 AM 04/19/2024    9:27 AM  In your present state of health, do you have any difficulty performing the following activities:  Hearing? 0 0  Vision? 0 0  Difficulty concentrating or making decisions? 0 0  Walking or climbing stairs? 0 0  Dressing or bathing? 0 0  Doing errands, shopping? 0 0  Preparing Food and eating ?  N  Using the Toilet?  N  In the past six months, have you accidently leaked urine?  N  Do you have problems with loss of bowel control?  N  Managing your Medications?  N  Managing your Finances?  N  Housekeeping or managing your Housekeeping?  N  Physical Exam   Physical Exam Vitals and nursing note reviewed.  Constitutional:      General: He is not in acute distress.    Appearance: Normal appearance. He is not ill-appearing, toxic-appearing or diaphoretic.  HENT:     Head: Normocephalic.     Right Ear: External ear normal.     Left Ear: External ear normal.     Nose: Nose normal. No congestion or rhinorrhea.     Mouth/Throat:     Mouth: Mucous membranes are moist.  Eyes:     General:        Right eye: No discharge.        Left eye: No discharge.     Extraocular Movements: Extraocular movements intact.     Conjunctiva/sclera: Conjunctivae normal.     Pupils: Pupils are equal, round, and reactive to light.  Cardiovascular:     Rate and Rhythm: Normal rate and regular rhythm.     Heart sounds: No murmur heard. Pulmonary:     Effort:  Pulmonary effort is normal. No respiratory distress.     Breath sounds: Normal breath sounds. No wheezing, rhonchi or rales.  Abdominal:     General: Abdomen is flat. Bowel sounds are normal.  Musculoskeletal:     Cervical back: Normal range of motion and neck supple.  Skin:    General: Skin is warm and dry.     Capillary Refill: Capillary refill takes less than 2 seconds.  Neurological:     General: No focal deficit present.     Mental Status: He is alert and oriented to person, place, and time.  Psychiatric:        Mood and Affect: Mood normal.        Behavior: Behavior normal.        Thought Content: Thought content normal.        Judgment: Judgment normal.    (optional), or other factors deemed appropriate based on the beneficiary's medical and social history and current clinical standards.   Advanced Directives: Does Patient Have a Medical Advance Directive?: No Would patient like information on creating a medical advance directive?: No - Patient declined   EKG:  normal EKG, normal sinus rhythm     Assessment:    This is a routine wellness  examination for this patient .   Vision/Hearing screen Hearing Screening   500Hz  1000Hz  2000Hz  4000Hz   Right ear 20 20 20 20   Left ear 20 20 20 20    Vision Screening   Right eye Left eye Both eyes  Without correction 20/50 20/20 20/20   With correction        Goals   None      Depression Screen    04/22/2024    9:17 AM 04/15/2024    3:26 PM 12/04/2023    8:33 AM 05/28/2023    1:09 PM  PHQ 2/9 Scores  PHQ - 2 Score 0 0 0 0  PHQ- 9 Score 0  1 0     Fall Risk    04/22/2024   11:07 AM  Fall Risk   Falls in the past year? 0  Number falls in past yr: 0  Injury with Fall? 0  Risk for fall due to : No Fall Risks  Follow up Falls evaluation completed    Cognitive Function        04/22/2024    9:13 AM  6CIT Screen  What Year? 0 points  What month? 0 points  What time? 0  points  Count back from 20 0 points   Months in reverse 0 points  Repeat phrase 0 points  Total Score 0 points    Patient Care Team: Melvin Pao, NP as PCP - General (Nurse Practitioner)     Plan:    I have personally reviewed and noted the following in the patient's chart:   Medical and social history Use of alcohol, tobacco or illicit drugs  Current medications and supplements including opioid prescriptions. Patient is not currently taking opioid prescriptions. Functional ability and status Nutritional status Physical activity Advanced directives List of other physicians Hospitalizations, surgeries, and ER visits in previous 12 months Vitals Screenings to include cognitive, depression, and falls Referrals and appointments  In addition, I have reviewed and discussed with patient certain preventive protocols, quality metrics, and best practice recommendations. A written personalized care plan for preventive services as well as general preventive health recommendations were provided to patient.     Pao Melvin, NP 04/22/2024

## 2024-04-22 NOTE — Assessment & Plan Note (Signed)
 Chronic.  Controlled.  A1c is 5.9% while in the hospital two weeks ago.  Continue with current medication regimen.  Will check labs at next visit.  Return to clinic in 6 months for reevaluation.  Call sooner if concerns arise.

## 2024-04-22 NOTE — Assessment & Plan Note (Signed)
Chronic.  Controlled.  Continue with current medication regimen of Rosuvastatin daily.  Labs ordered today.  Return to clinic in 6 months for reevaluation.  Call sooner if concerns arise.   

## 2024-04-22 NOTE — Assessment & Plan Note (Signed)
 A voluntary discussion about advance care planning including the explanation and discussion of advance directives was extensively discussed  with the patient for 3 minutes with patient. Explanation about the health care proxy and Living will was reviewed and packet with forms with explanation of how to fill them out was given.  During this discussion, the patient was able to identify a health care proxy as his daughter.  He does not wish to make any changes at this time.

## 2024-04-22 NOTE — Assessment & Plan Note (Signed)
 Chronic.  Controlled without medication.  Continue to check blood pressure at home and bring log to next visit. Labs ordered today.  Return to clinic in 6 months for reevaluation.  Call sooner if concerns arise.

## 2024-04-22 NOTE — Progress Notes (Signed)
 BP 128/83 (BP Location: Left Arm, Patient Position: Sitting, Cuff Size: Large)   Pulse 82   Ht 5' 11 (1.803 m)   Wt 236 lb (107 kg)   SpO2 96%   BMI 32.92 kg/m    Subjective:    Patient ID: Jason Frye, male    DOB: [birth year], 68 y.o.   MRN: [id number]  HPI: Jason Frye is a 68 y.o. male  Chief Complaint  Patient presents with   Medicare Wellness    HYPERTENSION / HYPERLIPIDEMIA Doing well with Rousvastatin.  Satisfied with current treatment? yes Duration of hypertension: years BP monitoring frequency: regularly BP range: <120/75 BP medication side effects: no Past BP meds: none Duration of hyperlipidemia: years Cholesterol medication side effects: no Cholesterol supplements: none Past cholesterol medications: rosuvastatin  (crestor ) and simvastatin  (zocor ) Medication compliance: excellent compliance Aspirin : no Recent stressors: no Recurrent headaches: no Visual changes: no Palpitations: no Dyspnea: no Chest pain: no Lower extremity edema: no Dizzy/lightheaded: no    Relevant past medical, surgical, family and social history reviewed and updated as indicated. Interim medical history since our last visit reviewed. Allergies and medications reviewed and updated.  Review of Systems  Eyes:  Negative for visual disturbance.  Respiratory:  Negative for shortness of breath.   Cardiovascular:  Negative for chest pain and leg swelling.  Neurological:  Negative for light-headedness and headaches.    Per HPI unless specifically indicated above     Objective:    BP 128/83 (BP Location: Left Arm, Patient Position: Sitting, Cuff Size: Large)   Pulse 82   Ht 5' 11 (1.803 m)   Wt 236 lb (107 kg)   SpO2 96%   BMI 32.92 kg/m   Wt Readings from Last 3 Encounters:  04/22/24 236 lb (107 kg)  04/15/24 235 lb 6.4 oz (106.8 kg)  04/06/24 235 lb (106.6 kg)    Physical Exam Vitals and nursing note reviewed.  Constitutional:      General: He is not in acute  distress.    Appearance: Normal appearance. He is not ill-appearing, toxic-appearing or diaphoretic.  HENT:     Head: Normocephalic.     Right Ear: External ear normal.     Left Ear: External ear normal.     Nose: Nose normal. No congestion or rhinorrhea.     Mouth/Throat:     Mouth: Mucous membranes are moist.  Eyes:     General:        Right eye: No discharge.        Left eye: No discharge.     Extraocular Movements: Extraocular movements intact.     Conjunctiva/sclera: Conjunctivae normal.     Pupils: Pupils are equal, round, and reactive to light.  Cardiovascular:     Rate and Rhythm: Normal rate and regular rhythm.     Heart sounds: Murmur heard.  Pulmonary:     Effort: Pulmonary effort is normal. No respiratory distress.     Breath sounds: Normal breath sounds. No wheezing, rhonchi or rales.  Abdominal:     General: Abdomen is flat. Bowel sounds are normal.  Musculoskeletal:     Cervical back: Normal range of motion and neck supple.  Skin:    General: Skin is warm and dry.     Capillary Refill: Capillary refill takes less than 2 seconds.  Neurological:     General: No focal deficit present.     Mental Status: He is alert and oriented to person, place, and time.  Psychiatric:  Mood and Affect: Mood normal.        Behavior: Behavior normal.        Thought Content: Thought content normal.        Judgment: Judgment normal.     Results for orders placed or performed in visit on 04/15/24  Comp Met (CMET)   Collection Time: 04/15/24  3:36 PM  Result Value Ref Range   Glucose 87 70 - 99 mg/dL   BUN 17 8 - 27 mg/dL   Creatinine, Ser 8.99 0.76 - 1.27 mg/dL   eGFR 84 >40 fO/fpw/8.26   BUN/Creatinine Ratio 17 10 - 24   Sodium 139 134 - 144 mmol/L   Potassium 4.4 3.5 - 5.2 mmol/L   Chloride 99 96 - 106 mmol/L   CO2 23 20 - 29 mmol/L   Calcium  10.3 (H) 8.6 - 10.2 mg/dL   Total Protein 7.6 6.0 - 8.5 g/dL   Albumin 4.3 3.9 - 4.9 g/dL   Globulin, Total 3.3 1.5 - 4.5  g/dL   Bilirubin Total 0.2 0.0 - 1.2 mg/dL   Alkaline Phosphatase 90 47 - 123 IU/L   AST 23 0 - 40 IU/L   ALT 24 0 - 44 IU/L  CBC w/Diff   Collection Time: 04/15/24  3:36 PM  Result Value Ref Range   WBC 8.2 3.4 - 10.8 x10E3/uL   RBC 4.78 4.14 - 5.80 x10E6/uL   Hemoglobin 14.1 13.0 - 17.7 g/dL   Hematocrit 56.6 62.4 - 51.0 %   MCV 91 79 - 97 fL   MCH 29.5 26.6 - 33.0 pg   MCHC 32.6 31.5 - 35.7 g/dL   RDW 87.0 88.3 - 84.5 %   Platelets 383 150 - 450 x10E3/uL   Neutrophils 54 Not Estab. %   Lymphs 32 Not Estab. %   Monocytes 9 Not Estab. %   Eos 4 Not Estab. %   Basos 0 Not Estab. %   Neutrophils Absolute 4.4 1.4 - 7.0 x10E3/uL   Lymphocytes Absolute 2.7 0.7 - 3.1 x10E3/uL   Monocytes Absolute 0.8 0.1 - 0.9 x10E3/uL   EOS (ABSOLUTE) 0.3 0.0 - 0.4 x10E3/uL   Basophils Absolute 0.0 0.0 - 0.2 x10E3/uL   Immature Granulocytes 1 Not Estab. %   Immature Grans (Abs) 0.1 0.0 - 0.1 x10E3/uL      Assessment & Plan:   Problem List Items Addressed This Visit       Cardiovascular and Mediastinum   Primary hypertension   Chronic.  Controlled without medication.  Continue to check blood pressure at home and bring log to next visit. Labs ordered today.  Return to clinic in 6 months for reevaluation.  Call sooner if concerns arise.       Relevant Medications   rosuvastatin  (CRESTOR ) 20 MG tablet   Other Relevant Orders   Comprehensive metabolic panel with GFR   Aortic atherosclerosis   Chronic.  Continue with ASA. Continue Crestor  to 20mg  daily.  Found on CT during hospitalization in September 2025.       Relevant Medications   rosuvastatin  (CRESTOR ) 20 MG tablet     Other   Hyperlipidemia   Chronic.  Controlled.  Continue with current medication regimen of Rosuvastatin  daily.  Labs ordered today.  Return to clinic in 6 months for reevaluation.  Call sooner if concerns arise.       Relevant Medications   rosuvastatin  (CRESTOR ) 20 MG tablet   Other Relevant Orders   Lipid  panel   Prediabetes  Chronic.  Controlled.  A1c is 5.9% while in the hospital two weeks ago.  Continue with current medication regimen.  Will check labs at next visit.  Return to clinic in 6 months for reevaluation.  Call sooner if concerns arise.        Advanced care planning/counseling discussion   A voluntary discussion about advance care planning including the explanation and discussion of advance directives was extensively discussed  with the patient for 3 minutes with patient. Explanation about the health care proxy and Living will was reviewed and packet with forms with explanation of how to fill them out was given.  During this discussion, the patient was able to identify a health care proxy as his daughter.  He does not wish to make any changes at this time.         Other Visit Diagnoses       Welcome to Medicare preventive visit    -  Primary   EKG showed NSR.   Relevant Orders   EKG 12-Lead        Follow up plan: Return in about 6 months (around 10/20/2024) for Physical and Fasting labs.

## 2024-04-23 ENCOUNTER — Ambulatory Visit: Payer: Self-pay | Admitting: Nurse Practitioner

## 2024-04-23 LAB — COMPREHENSIVE METABOLIC PANEL WITH GFR
ALT: 20 IU/L (ref 0–44)
AST: 21 IU/L (ref 0–40)
Albumin: 4.3 g/dL (ref 3.9–4.9)
Alkaline Phosphatase: 88 IU/L (ref 47–123)
BUN/Creatinine Ratio: 18 (ref 10–24)
BUN: 19 mg/dL (ref 8–27)
Bilirubin Total: 0.4 mg/dL (ref 0.0–1.2)
CO2: 23 mmol/L (ref 20–29)
Calcium: 9.7 mg/dL (ref 8.6–10.2)
Chloride: 99 mmol/L (ref 96–106)
Creatinine, Ser: 1.07 mg/dL (ref 0.76–1.27)
Globulin, Total: 3.1 g/dL (ref 1.5–4.5)
Glucose: 91 mg/dL (ref 70–99)
Potassium: 4.4 mmol/L (ref 3.5–5.2)
Sodium: 139 mmol/L (ref 134–144)
Total Protein: 7.4 g/dL (ref 6.0–8.5)
eGFR: 77 mL/min/1.73 (ref >59)

## 2024-04-23 LAB — LIPID PANEL
Chol/HDL Ratio: 5 ratio (ref 0.0–5.0)
Cholesterol, Total: 180 mg/dL (ref 100–199)
HDL: 36 mg/dL — ABNORMAL LOW (ref >39)
LDL Chol Calc (NIH): 106 mg/dL — ABNORMAL HIGH (ref 0–99)
Triglycerides: 219 mg/dL — ABNORMAL HIGH (ref 0–149)
VLDL Cholesterol Cal: 38 mg/dL (ref 5–40)

## 2024-04-30 ENCOUNTER — Encounter: Payer: Self-pay | Admitting: Nurse Practitioner

## 2024-04-30 ENCOUNTER — Ambulatory Visit: Admitting: Nurse Practitioner

## 2024-04-30 VITALS — BP 121/77 | HR 86 | Ht 71.0 in | Wt 239.6 lb

## 2024-04-30 DIAGNOSIS — M109 Gout, unspecified: Secondary | ICD-10-CM | POA: Diagnosis not present

## 2024-04-30 MED ORDER — COLCHICINE 0.6 MG PO TABS: 0.6000 mg | ORAL_TABLET | Freq: Two times a day (BID) | ORAL | 0 refills | Status: AC

## 2024-04-30 NOTE — Progress Notes (Signed)
 BP 121/77 (BP Location: Right Arm, Patient Position: Sitting, Cuff Size: Large)   Pulse 86   Ht 5' 11 (1.803 m)   Wt 239 lb 9.6 oz (108.7 kg)   SpO2 97%   BMI 33.42 kg/m    Subjective:    Patient ID: Jason Frye, male    DOB: 11/25/1955, 68 y.o.   MRN: [id number]  HPI: Jason Frye is a 68 y.o. male  Chief Complaint  Patient presents with   Gout    Left great toe, very sensitive to the touch, pain   GOUT Duration:3 days Right 1st metatarsophalangeal pain: no Left 1st metatarsophalangeal pain: yes Right knee pain: no Left knee pain: no Severity: moderate  Quality: sharp, dull, and throbbing Swelling: yes Redness: yes Trauma: no Recent dietary change or indiscretion: no Fevers: no Nausea/vomiting: no Aggravating factors: Alleviating factors:  Status:  worse Treatments attempted:  Relevant past medical, surgical, family and social history reviewed and updated as indicated. Interim medical history since our last visit reviewed. Allergies and medications reviewed and updated.  Review of Systems  Skin:        Left great toe swelling and pain    Per HPI unless specifically indicated above     Objective:    BP 121/77 (BP Location: Right Arm, Patient Position: Sitting, Cuff Size: Large)   Pulse 86   Ht 5' 11 (1.803 m)   Wt 239 lb 9.6 oz (108.7 kg)   SpO2 97%   BMI 33.42 kg/m   Wt Readings from Last 3 Encounters:  04/30/24 239 lb 9.6 oz (108.7 kg)  04/22/24 236 lb (107 kg)  04/15/24 235 lb 6.4 oz (106.8 kg)    Physical Exam Vitals and nursing note reviewed.  Constitutional:      General: He is not in acute distress.    Appearance: Normal appearance. He is not ill-appearing, toxic-appearing or diaphoretic.  HENT:     Head: Normocephalic.     Right Ear: External ear normal.     Left Ear: External ear normal.     Nose: Nose normal. No congestion or rhinorrhea.     Mouth/Throat:     Mouth: Mucous membranes are moist.  Eyes:     General:         Right eye: No discharge.        Left eye: No discharge.     Extraocular Movements: Extraocular movements intact.     Conjunctiva/sclera: Conjunctivae normal.     Pupils: Pupils are equal, round, and reactive to light.  Cardiovascular:     Rate and Rhythm: Normal rate and regular rhythm.     Heart sounds: No murmur heard. Pulmonary:     Effort: Pulmonary effort is normal. No respiratory distress.     Breath sounds: Normal breath sounds. No wheezing, rhonchi or rales.  Abdominal:     General: Abdomen is flat. Bowel sounds are normal.  Musculoskeletal:     Cervical back: Normal range of motion and neck supple.       Feet:  Skin:    General: Skin is warm and dry.     Capillary Refill: Capillary refill takes less than 2 seconds.  Neurological:     General: No focal deficit present.     Mental Status: He is alert and oriented to person, place, and time.  Psychiatric:        Mood and Affect: Mood normal.        Behavior: Behavior normal.  Thought Content: Thought content normal.        Judgment: Judgment normal.     Results for orders placed or performed in visit on 04/22/24  Comprehensive metabolic panel with GFR   Collection Time: 04/22/24  9:52 AM  Result Value Ref Range   Glucose 91 70 - 99 mg/dL   BUN 19 8 - 27 mg/dL   Creatinine, Ser 8.92 0.76 - 1.27 mg/dL   eGFR 77 >40 fO/fpw/8.26   BUN/Creatinine Ratio 18 10 - 24   Sodium 139 134 - 144 mmol/L   Potassium 4.4 3.5 - 5.2 mmol/L   Chloride 99 96 - 106 mmol/L   CO2 23 20 - 29 mmol/L   Calcium  9.7 8.6 - 10.2 mg/dL   Total Protein 7.4 6.0 - 8.5 g/dL   Albumin 4.3 3.9 - 4.9 g/dL   Globulin, Total 3.1 1.5 - 4.5 g/dL   Bilirubin Total 0.4 0.0 - 1.2 mg/dL   Alkaline Phosphatase 88 47 - 123 IU/L   AST 21 0 - 40 IU/L   ALT 20 0 - 44 IU/L  Lipid panel   Collection Time: 04/22/24  9:52 AM  Result Value Ref Range   Cholesterol, Total 180 100 - 199 mg/dL   Triglycerides 780 (H) 0 - 149 mg/dL   HDL 36 (L) >60 mg/dL    VLDL Cholesterol Cal 38 5 - 40 mg/dL   LDL Chol Calc (NIH) 893 (H) 0 - 99 mg/dL   Chol/HDL Ratio 5.0 0.0 - 5.0 ratio      Assessment & Plan:   Problem List Items Addressed This Visit   None Visit Diagnoses       Acute gout involving toe of left foot, unspecified cause    -  Primary   Will treat with Colchicine.  Complete course of medication.  Follow up if not improved.   Relevant Medications   colchicine 0.6 MG tablet        Follow up plan: Return if symptoms worsen or fail to improve.

## 2024-10-21 ENCOUNTER — Encounter: Admitting: Nurse Practitioner
